# Patient Record
Sex: Female | Born: 1955
Health system: Southern US, Community
[De-identification: ages and names within clinical notes are randomized; demographics above are authoritative.]

## PROBLEM LIST (undated history)

## (undated) DIAGNOSIS — M1990 Unspecified osteoarthritis, unspecified site: Secondary | ICD-10-CM

## (undated) DIAGNOSIS — F410 Panic disorder [episodic paroxysmal anxiety] without agoraphobia: Secondary | ICD-10-CM

## (undated) DIAGNOSIS — G629 Polyneuropathy, unspecified: Secondary | ICD-10-CM

## (undated) HISTORY — DX: Panic disorder (episodic paroxysmal anxiety): F41.0

## (undated) HISTORY — DX: Polyneuropathy, unspecified: G62.9

## (undated) HISTORY — PX: BASAL CELL CARCINOMA EXCISION: SHX1214

## (undated) HISTORY — PX: WISDOM TOOTH EXTRACTION: SHX21

## (undated) HISTORY — DX: Unspecified osteoarthritis, unspecified site: M19.90

---

## 1999-01-16 ENCOUNTER — Other Ambulatory Visit: Admission: RE | Admit: 1999-01-16 | Discharge: 1999-01-16 | Payer: Self-pay | Admitting: *Deleted

## 2000-01-15 ENCOUNTER — Encounter: Admission: RE | Admit: 2000-01-15 | Discharge: 2000-01-15 | Payer: Self-pay | Admitting: *Deleted

## 2000-01-15 ENCOUNTER — Encounter: Payer: Self-pay | Admitting: *Deleted

## 2000-05-29 ENCOUNTER — Other Ambulatory Visit: Admission: RE | Admit: 2000-05-29 | Discharge: 2000-05-29 | Payer: Self-pay | Admitting: *Deleted

## 2001-04-07 ENCOUNTER — Encounter: Admission: RE | Admit: 2001-04-07 | Discharge: 2001-04-07 | Payer: Self-pay | Admitting: *Deleted

## 2001-04-07 ENCOUNTER — Encounter: Payer: Self-pay | Admitting: *Deleted

## 2002-11-14 ENCOUNTER — Encounter: Payer: Self-pay | Admitting: *Deleted

## 2002-11-14 ENCOUNTER — Encounter: Admission: RE | Admit: 2002-11-14 | Discharge: 2002-11-14 | Payer: Self-pay | Admitting: *Deleted

## 2002-11-17 ENCOUNTER — Other Ambulatory Visit: Admission: RE | Admit: 2002-11-17 | Discharge: 2002-11-17 | Payer: Self-pay | Admitting: *Deleted

## 2004-02-14 ENCOUNTER — Encounter: Admission: RE | Admit: 2004-02-14 | Discharge: 2004-02-14 | Payer: Self-pay | Admitting: *Deleted

## 2005-02-26 ENCOUNTER — Encounter: Admission: RE | Admit: 2005-02-26 | Discharge: 2005-02-26 | Payer: Self-pay | Admitting: *Deleted

## 2006-03-25 ENCOUNTER — Encounter: Admission: RE | Admit: 2006-03-25 | Discharge: 2006-03-25 | Payer: Self-pay | Admitting: Obstetrics and Gynecology

## 2006-04-09 ENCOUNTER — Other Ambulatory Visit: Admission: RE | Admit: 2006-04-09 | Discharge: 2006-04-09 | Payer: Self-pay | Admitting: Obstetrics and Gynecology

## 2007-06-08 ENCOUNTER — Encounter: Admission: RE | Admit: 2007-06-08 | Discharge: 2007-06-08 | Payer: Self-pay | Admitting: Obstetrics and Gynecology

## 2007-06-14 ENCOUNTER — Other Ambulatory Visit: Admission: RE | Admit: 2007-06-14 | Discharge: 2007-06-14 | Payer: Self-pay | Admitting: Obstetrics and Gynecology

## 2008-06-08 ENCOUNTER — Encounter: Admission: RE | Admit: 2008-06-08 | Discharge: 2008-06-08 | Payer: Self-pay | Admitting: Obstetrics and Gynecology

## 2008-06-14 ENCOUNTER — Other Ambulatory Visit: Admission: RE | Admit: 2008-06-14 | Discharge: 2008-06-14 | Payer: Self-pay | Admitting: Obstetrics & Gynecology

## 2018-09-28 ENCOUNTER — Other Ambulatory Visit: Payer: Self-pay | Admitting: Ophthalmology

## 2018-09-28 DIAGNOSIS — G902 Horner's syndrome: Secondary | ICD-10-CM

## 2020-02-23 DIAGNOSIS — M25551 Pain in right hip: Secondary | ICD-10-CM | POA: Insufficient documentation

## 2020-02-29 DIAGNOSIS — M25551 Pain in right hip: Secondary | ICD-10-CM | POA: Diagnosis not present

## 2020-02-29 DIAGNOSIS — M1611 Unilateral primary osteoarthritis, right hip: Secondary | ICD-10-CM | POA: Diagnosis not present

## 2020-03-22 DIAGNOSIS — Z720 Tobacco use: Secondary | ICD-10-CM | POA: Diagnosis not present

## 2020-03-22 DIAGNOSIS — Z13220 Encounter for screening for lipoid disorders: Secondary | ICD-10-CM | POA: Diagnosis not present

## 2020-03-22 DIAGNOSIS — M169 Osteoarthritis of hip, unspecified: Secondary | ICD-10-CM | POA: Diagnosis not present

## 2020-03-22 DIAGNOSIS — R0609 Other forms of dyspnea: Secondary | ICD-10-CM | POA: Diagnosis not present

## 2020-03-22 DIAGNOSIS — R5383 Other fatigue: Secondary | ICD-10-CM | POA: Diagnosis not present

## 2020-03-23 ENCOUNTER — Ambulatory Visit: Payer: Self-pay | Admitting: Adult Health Nurse Practitioner

## 2020-03-27 DIAGNOSIS — E7849 Other hyperlipidemia: Secondary | ICD-10-CM | POA: Diagnosis not present

## 2020-03-27 DIAGNOSIS — R7989 Other specified abnormal findings of blood chemistry: Secondary | ICD-10-CM | POA: Diagnosis not present

## 2020-03-27 DIAGNOSIS — R5383 Other fatigue: Secondary | ICD-10-CM | POA: Diagnosis not present

## 2020-03-30 ENCOUNTER — Ambulatory Visit: Payer: Self-pay | Admitting: Adult Health Nurse Practitioner

## 2020-04-13 ENCOUNTER — Other Ambulatory Visit: Payer: Self-pay

## 2020-04-13 ENCOUNTER — Encounter: Payer: Self-pay | Admitting: Cardiology

## 2020-04-13 ENCOUNTER — Ambulatory Visit: Payer: BC Managed Care – PPO | Admitting: Cardiology

## 2020-04-13 VITALS — BP 115/82 | HR 78 | Ht 62.0 in | Wt 170.0 lb

## 2020-04-13 DIAGNOSIS — Z01810 Encounter for preprocedural cardiovascular examination: Secondary | ICD-10-CM | POA: Diagnosis not present

## 2020-04-13 DIAGNOSIS — F17200 Nicotine dependence, unspecified, uncomplicated: Secondary | ICD-10-CM | POA: Insufficient documentation

## 2020-04-13 DIAGNOSIS — F17210 Nicotine dependence, cigarettes, uncomplicated: Secondary | ICD-10-CM | POA: Diagnosis not present

## 2020-04-13 DIAGNOSIS — R0609 Other forms of dyspnea: Secondary | ICD-10-CM

## 2020-04-13 DIAGNOSIS — R0602 Shortness of breath: Secondary | ICD-10-CM | POA: Insufficient documentation

## 2020-04-13 DIAGNOSIS — R0600 Dyspnea, unspecified: Secondary | ICD-10-CM

## 2020-04-13 NOTE — Progress Notes (Addendum)
Patient referred by Michael Boston, MD for pre-op risk stratification  Subjective:   Denise Schwartz, female    DOB: [birth year], 64 y.o.   MRN: [id number]   Chief Complaint  Patient presents with  . New Patient (Initial Visit)  . Medical Clearance    Surgery  . Shortness of Breath     HPI  64 y.o. Caucasian female with tobacco dependence, hyperlipidemia, osteoarthritis  Patients activity has been limited due to her right hip pain, for which she is going to undergo surgery in the near future. While she reports random episodes of chest pain lasting for few min at rest, she is able to climb total of 40 steps to her apartment with grocery bags in hand without any difficulty or chest pain.She has stable, unchanged shortness of breath on exertion for many years.    Past Medical History:  Diagnosis Date  . Neuropathy   . Osteoarthritis   . Panic disorder      Past Surgical History:  Procedure Laterality Date  . BASAL CELL CARCINOMA EXCISION    . WISDOM TOOTH EXTRACTION       Social History   Tobacco Use  Smoking Status Not on file    Social History   Substance and Sexual Activity  Alcohol Use None     Family History  Problem Relation Age of Onset  . Cancer Mother 65  . Osteoarthritis Mother   . Alzheimer's disease Father   . Hypertension Father   . Neuropathy Father   . Hypertension Brother   . Hypertension Brother   . Hypertension Brother   . High Cholesterol Brother   . Cancer Brother      Current Outpatient Medications on File Prior to Visit  Medication Sig Dispense Refill  . acetaminophen (TYLENOL) 325 MG tablet Take 650 mg by mouth every 6 (six) hours as needed.    Marland Kitchen BABY ASPIRIN PO Take 1 tablet by mouth daily as needed.    . diclofenac (VOLTAREN) 75 MG EC tablet Take 1 tablet by mouth 2 (two) times daily as needed.    . loratadine-pseudoephedrine (ALLERGY RELIEF D-12) 5-120 MG tablet Take 1 tablet by mouth 2 (two) times daily as needed for  allergies.    . Multiple Vitamin (MULTIVITAMIN) tablet Take 1 tablet by mouth daily.     No current facility-administered medications on file prior to visit.    Cardiovascular and other pertinent studies:  EKG 04/13/2020: Sinus rhythm 85 bpm Nonspecific ST-T changes    Recent labs: 03/22/2020: Glucose 104, BUN/Cr 16/0.8. EGFR 72. Na/K 138/5.5. Rest of the CMP normal H/H 15/45. MCV 104.6. Platelets 390 HbA1C N/A Chol 233, TG 94, HDL 67, LDL 147 TSH 2.1 normal   Review of Systems  Cardiovascular: Positive for dyspnea on exertion. Negative for chest pain (Non-exertional), leg swelling, palpitations and syncope.  Musculoskeletal: Positive for joint pain.         Vitals:   04/13/20 1112  BP: 115/82  Pulse: 78  SpO2: 97%     Body mass index is 31.09 kg/m. Filed Weights   04/13/20 1112  Weight: 170 lb (77.1 kg)     Objective:   Physical Exam Vitals and nursing note reviewed.  Constitutional:      General: She is not in acute distress. Neck:     Vascular: No JVD.  Cardiovascular:     Rate and Rhythm: Normal rate and regular rhythm.     Pulses: Intact distal pulses.  Heart sounds: Normal heart sounds. No murmur heard.   Pulmonary:     Effort: Pulmonary effort is normal.     Breath sounds: Normal breath sounds. No wheezing or rales.         Assessment & Recommendations:   64 y.o. Caucasian female with tobacco dependence, hyperlipidemia, osteoarthritis, pre-op risk stratification  Pre-op risk stratification: Unchanged stable exertional dyspnea most likely related to tobacco dependence. Will obtain echocardiogram. Decent functional capacity in spite of hip pain, as per HPI, without any chest pain. I do not think stress testing will mitigate her medium cardiovascular risk, owing to her history of smoking. Strongly recommend quitting smoking, currently smokes <1 pack/day. Offered pharmacological therapy, However, she is not interested at this time.Also,  recommended  Statin given her 12.2% 10 yr ASCVD risk, She wants to hold off at this time.  If echocardiogram does not show any severe abnormalities, may proceed with hip surgery with acceptable risk.     Thank you for referring the patient to [place]. Please feel free to contact with any questions.  Nigel Mormon, MD [facility] Pager: [phone] Office: [phone]

## 2020-04-18 NOTE — Telephone Encounter (Signed)
From patient.

## 2020-04-25 ENCOUNTER — Ambulatory Visit: Payer: BC Managed Care – PPO

## 2020-04-25 ENCOUNTER — Other Ambulatory Visit: Payer: Self-pay

## 2020-04-25 DIAGNOSIS — Z0189 Encounter for other specified special examinations: Secondary | ICD-10-CM | POA: Diagnosis not present

## 2020-04-25 DIAGNOSIS — Z01810 Encounter for preprocedural cardiovascular examination: Secondary | ICD-10-CM | POA: Diagnosis not present

## 2020-04-25 DIAGNOSIS — R0609 Other forms of dyspnea: Secondary | ICD-10-CM | POA: Diagnosis not present

## 2020-04-25 DIAGNOSIS — R0600 Dyspnea, unspecified: Secondary | ICD-10-CM

## 2020-04-25 LAB — PROTIME-INR: INR: 0.9 (ref 0.9–1.1)

## 2020-04-30 ENCOUNTER — Ambulatory Visit: Payer: Self-pay | Admitting: Family Medicine

## 2020-05-01 NOTE — Progress Notes (Signed)
Patient called back, I have discussed results with her and transferred call to [facility] to confirm surgical clearance paperwork.

## 2020-05-01 NOTE — Progress Notes (Signed)
Called patient to discuss results left voicemail for patient to return phone call

## 2020-05-02 ENCOUNTER — Ambulatory Visit: Payer: Self-pay | Admitting: Family Medicine

## 2020-05-03 HISTORY — PX: TOTAL HIP ARTHROPLASTY: SHX124

## 2020-05-21 DIAGNOSIS — M1611 Unilateral primary osteoarthritis, right hip: Secondary | ICD-10-CM | POA: Diagnosis not present

## 2020-05-23 ENCOUNTER — Ambulatory Visit: Payer: Self-pay | Admitting: Internal Medicine

## 2020-07-11 DIAGNOSIS — Z96641 Presence of right artificial hip joint: Secondary | ICD-10-CM | POA: Diagnosis not present

## 2020-07-11 DIAGNOSIS — Z471 Aftercare following joint replacement surgery: Secondary | ICD-10-CM | POA: Diagnosis not present

## 2020-08-14 DIAGNOSIS — E785 Hyperlipidemia, unspecified: Secondary | ICD-10-CM | POA: Diagnosis not present

## 2020-08-14 DIAGNOSIS — Z0000 Encounter for general adult medical examination without abnormal findings: Secondary | ICD-10-CM | POA: Diagnosis not present

## 2020-08-21 DIAGNOSIS — E785 Hyperlipidemia, unspecified: Secondary | ICD-10-CM | POA: Diagnosis not present

## 2020-08-21 DIAGNOSIS — Z0000 Encounter for general adult medical examination without abnormal findings: Secondary | ICD-10-CM | POA: Diagnosis not present

## 2020-08-22 ENCOUNTER — Encounter: Payer: Self-pay | Admitting: Nurse Practitioner

## 2020-08-24 DIAGNOSIS — Z96641 Presence of right artificial hip joint: Secondary | ICD-10-CM | POA: Diagnosis not present

## 2020-08-27 ENCOUNTER — Other Ambulatory Visit: Payer: Self-pay | Admitting: Internal Medicine

## 2020-08-27 DIAGNOSIS — Z1231 Encounter for screening mammogram for malignant neoplasm of breast: Secondary | ICD-10-CM

## 2020-09-13 ENCOUNTER — Ambulatory Visit (INDEPENDENT_AMBULATORY_CARE_PROVIDER_SITE_OTHER): Payer: BC Managed Care – PPO | Admitting: Nurse Practitioner

## 2020-09-13 ENCOUNTER — Other Ambulatory Visit (HOSPITAL_COMMUNITY)
Admission: RE | Admit: 2020-09-13 | Discharge: 2020-09-13 | Disposition: A | Discharge status: Home / Self Care | Payer: BC Managed Care – PPO | Source: Ambulatory Visit | Attending: Nurse Practitioner | Admitting: Nurse Practitioner

## 2020-09-13 ENCOUNTER — Encounter: Payer: Self-pay | Admitting: Nurse Practitioner

## 2020-09-13 ENCOUNTER — Other Ambulatory Visit: Payer: Self-pay

## 2020-09-13 VITALS — BP 118/74 | HR 72 | Resp 16 | Ht 61.75 in | Wt 185.0 lb

## 2020-09-13 DIAGNOSIS — Z780 Asymptomatic menopausal state: Secondary | ICD-10-CM

## 2020-09-13 DIAGNOSIS — Z01419 Encounter for gynecological examination (general) (routine) without abnormal findings: Secondary | ICD-10-CM

## 2020-09-13 DIAGNOSIS — Z720 Tobacco use: Secondary | ICD-10-CM | POA: Diagnosis not present

## 2020-09-13 NOTE — Progress Notes (Signed)
64 y.o. G0P0000 Single White or Caucasian female here for annual exam.      Last physical 10 years ago 64 year female partner, passed away 50 + smoking Strong support with family (nieces) Had hip surgery earlier this year and realized she has a lot of health maintenance to catch up on. Has a PCP, had a lot of blood work done but not sure what. (She knows cholesterol because that was high and she was sent to cardiologist. Everything was okay, does not want to take meds because she was told her overall risk was low)  No LMP recorded. Patient is postmenopausal.  Denies any post menopausal bleeding    Sexually active: No.  The current method of family planning is abstinence.   Female partner Exercising: No.  exercise Smoker:  no  Health Maintenance: Pap:  Maybe 2010  History of abnormal Pap:  no MMG:  2012 per patient, scheduled 10/2020 Colonoscopy:  none BMD:   none TDaP:  Unsure, declines Gardasil:   n/a Covid-19: had done Pneumonia vaccine(s):  Not done Shingrix:   Not done Hep C testing: unsure Screening Labs: see above note   reports that she has been smoking cigarettes. She has never used smokeless tobacco. She reports that she does not use drugs.  Past Medical History:  Diagnosis Date  . Neuropathy   . Osteoarthritis   . Panic disorder     Past Surgical History:  Procedure Laterality Date  . BASAL CELL CARCINOMA EXCISION    . TOTAL HIP ARTHROPLASTY Right 05/2020  . WISDOM TOOTH EXTRACTION      Current Outpatient Medications  Medication Sig Dispense Refill  . acetaminophen (TYLENOL) 325 MG tablet Take 650 mg by mouth every 6 (six) hours as needed.    Marland Kitchen amoxicillin (AMOXIL) 500 MG tablet amoxicillin 500 mg tablet  take 4 tab 1 hour prior to dental work    . diclofenac (VOLTAREN) 75 MG EC tablet Take 1 tablet by mouth 2 (two) times daily as needed.    . loratadine-pseudoephedrine (ALLERGY RELIEF D-12) 5-120 MG tablet Take 1 tablet by mouth 2 (two) times daily as  needed for allergies.    . Multiple Vitamin (MULTIVITAMIN) tablet Take 1 tablet by mouth as needed.      No current facility-administered medications for this visit.    Family History  Problem Relation Age of Onset  . Cancer Mother 45  . Osteoarthritis Mother   . Uterine cancer Mother   . Alzheimer's disease Father   . Hypertension Father   . Neuropathy Father   . Hypertension Brother   . Hypertension Brother   . Hypertension Brother   . Cancer Brother   . Throat cancer Brother   . Diabetes Paternal Grandmother     Review of Systems  Constitutional: Negative.   HENT: Negative.   Eyes: Negative.   Respiratory: Negative.   Cardiovascular: Negative.   Gastrointestinal: Negative.   Endocrine: Negative.   Genitourinary: Negative.   Musculoskeletal: Negative.   Skin: Negative.   Allergic/Immunologic: Negative.   Neurological: Negative.   Hematological: Negative.   Psychiatric/Behavioral: Negative.     Exam:   BP 118/74   Pulse 72   Resp 16   Ht 5' 1.75" (1.568 m)   Wt 185 lb (83.9 kg)   BMI 34.11 kg/m   Height: 5' 1.75" (156.8 cm)  General appearance: alert, cooperative and appears stated age Head: Normocephalic, without obvious abnormality, atraumatic Neck: no adenopathy, supple, symmetrical, trachea midline and thyroid  normal to inspection and palpation Lungs: clear to auscultation bilaterally Breasts: normal appearance, no masses or tenderness Heart: regular rate and rhythm Abdomen: soft, non-tender; bowel sounds normal; no masses,  no organomegaly Extremities: extremities normal, atraumatic, no cyanosis or edema Skin: Skin color, texture, turgor normal. No rashes or lesions Lymph nodes: Cervical, supraclavicular, and axillary nodes normal. No abnormal inguinal nodes palpated Neurologic: Grossly normal   Pelvic: External genitalia:  no lesions              Urethra:  normal appearing urethra with no masses, tenderness or lesions              Bartholins and  Skenes: normal                 Vagina: normal appearing vagina with normal color and discharge, no lesions              Cervix: nulliparous appearance, difficult to visualize in entirety  r/t  Use of adolescent pederson speculum needed to do exam              Pap taken: Yes.   Bimanual Exam:  Uterus:  normal              Adnexa: no mass, fullness, tenderness               Rectovaginal: Pt declined   Joy, CMA Chaperone was present for exam.  A:  Well Woman with normal exam  P:   Mammogram, ordered by PCP, scheduled for December 2021  pap smear/ HPV collected today  DEXA scan ordered (pt to call breast center and see if can be done same day as mammogram). Risks: long time smoking history  Labs today: vit D, HIV, RPR, Hep C.   Pt will sign medical release to review labs from PCP to see if any additional recommendations  Cologuard ordered   return annually or prn

## 2020-09-13 NOTE — Patient Instructions (Addendum)
I am glad you made it in today! It was nice to meet you. Today we caught up on things: Pap smear/HPV test Lab work: (one time): HIV, RPR, Hep C and vitamin D  Considering taking Calcium 1200mg  daily/vit d 800 IU daily (unless lab work abnormal, then will update recommendations)  We have ordered your colguard and it should come to you in the mail.  Your results will come to you on MyChart.

## 2020-09-14 ENCOUNTER — Other Ambulatory Visit: Payer: Self-pay | Admitting: Nurse Practitioner

## 2020-09-14 DIAGNOSIS — Z780 Asymptomatic menopausal state: Secondary | ICD-10-CM

## 2020-09-14 DIAGNOSIS — Z720 Tobacco use: Secondary | ICD-10-CM

## 2020-09-14 LAB — HEPATITIS C ANTIBODY: Hep C Virus Ab: <0.1 s/co ratio (ref 0.0–0.9)

## 2020-09-14 LAB — HIV ANTIBODY (ROUTINE TESTING W REFLEX): HIV Screen 4th Generation wRfx: NONREACTIVE

## 2020-09-14 LAB — VITAMIN D 25 HYDROXY (VIT D DEFICIENCY, FRACTURES): Vit D, 25-Hydroxy: 28.2 ng/mL — ABNORMAL LOW (ref 30.0–100.0)

## 2020-09-14 LAB — RPR: RPR Ser Ql: NONREACTIVE

## 2020-09-18 LAB — CYTOLOGY - PAP
Comment: NEGATIVE
Diagnosis: NEGATIVE
Diagnosis: REACTIVE
High risk HPV: NEGATIVE

## 2020-09-20 ENCOUNTER — Telehealth: Payer: Self-pay

## 2020-09-20 ENCOUNTER — Other Ambulatory Visit: Payer: Self-pay | Admitting: Nurse Practitioner

## 2020-09-20 DIAGNOSIS — R87619 Unspecified abnormal cytological findings in specimens from cervix uteri: Secondary | ICD-10-CM

## 2020-09-20 DIAGNOSIS — Z8049 Family history of malignant neoplasm of other genital organs: Secondary | ICD-10-CM

## 2020-09-20 NOTE — Telephone Encounter (Signed)
Spoke with pt. Pt given results and recommendations per Tresa Endo, NP. Pt verbalized understanding and is agreeable to PUS and EMB. Pt aware Dr Oscar La will do procedures.  Pt scheduled with Dr Oscar La on 11/30 at 230 pm. Pt agreeable to date and time of appts. Pt advised can take Motrin 800 mg with food and water one hour before procedure. Reviewed cancellation policy and pt is aware of transvaginal US.  Routing to Dr Oscar La for review [id number] for precert  Orders placed  Encounter closed  [name], I attempted to call you about your pap smear, but I didn't get an answer. You can send a message back to me if you have any questions and we can discuss further.  The cells on your cervix look normal and the HPV test was negative, so the actual pap smear part was normal. The part that needs further evaluation is that they saw some endometrial cells, and typically they are not seen. There is an increase risk of endometrial cancer (uterine cancer) in women where these cells are seen. It doesn't mean you have cancer, but it means we have to do a test to see. I know that this kind of cancer is in your family history, so it is important to test you. I recommend that you have a pelvic ultrasound and then an Endometrial Biopsy so you can get the answers you need. The nurse should be calling you to get you scheduled for these procedures.  Written by Clarita Crane, NP on 09/20/2020 9:28 AM EST Seen by patient Denise Schwartz on 09/20/2020 10:04 AM

## 2020-09-20 NOTE — Telephone Encounter (Signed)
Patient is calling to discuss pap results and next steps needed.

## 2020-09-24 ENCOUNTER — Telehealth: Payer: Self-pay

## 2020-09-24 NOTE — Telephone Encounter (Signed)
Call to patient. Unable to leave voicemail requesting a return call to [facility] to review benefits for scheduled Pelvic ultrasound and Endometrial Biopsy with Gertie Exon, MD

## 2020-10-02 ENCOUNTER — Encounter: Payer: Self-pay | Admitting: Obstetrics and Gynecology

## 2020-10-02 ENCOUNTER — Ambulatory Visit (INDEPENDENT_AMBULATORY_CARE_PROVIDER_SITE_OTHER): Payer: BC Managed Care – PPO

## 2020-10-02 ENCOUNTER — Other Ambulatory Visit (HOSPITAL_COMMUNITY)
Admission: RE | Admit: 2020-10-02 | Discharge: 2020-10-02 | Disposition: A | Discharge status: Home / Self Care | Payer: BC Managed Care – PPO | Source: Ambulatory Visit | Attending: Obstetrics and Gynecology | Admitting: Obstetrics and Gynecology

## 2020-10-02 ENCOUNTER — Other Ambulatory Visit: Payer: Self-pay | Admitting: Obstetrics and Gynecology

## 2020-10-02 ENCOUNTER — Other Ambulatory Visit: Payer: Self-pay

## 2020-10-02 ENCOUNTER — Ambulatory Visit (INDEPENDENT_AMBULATORY_CARE_PROVIDER_SITE_OTHER): Payer: BC Managed Care – PPO | Admitting: Obstetrics and Gynecology

## 2020-10-02 VITALS — BP 128/80 | HR 89 | Resp 16 | Wt 185.0 lb

## 2020-10-02 DIAGNOSIS — Z8049 Family history of malignant neoplasm of other genital organs: Secondary | ICD-10-CM | POA: Insufficient documentation

## 2020-10-02 DIAGNOSIS — R87619 Unspecified abnormal cytological findings in specimens from cervix uteri: Secondary | ICD-10-CM | POA: Diagnosis not present

## 2020-10-02 DIAGNOSIS — Z6834 Body mass index (BMI) 34.0-34.9, adult: Secondary | ICD-10-CM

## 2020-10-02 DIAGNOSIS — N882 Stricture and stenosis of cervix uteri: Secondary | ICD-10-CM | POA: Diagnosis not present

## 2020-10-02 DIAGNOSIS — Z1212 Encounter for screening for malignant neoplasm of rectum: Secondary | ICD-10-CM | POA: Diagnosis not present

## 2020-10-02 DIAGNOSIS — Z1211 Encounter for screening for malignant neoplasm of colon: Secondary | ICD-10-CM | POA: Diagnosis not present

## 2020-10-02 NOTE — Progress Notes (Signed)
GYNECOLOGY  VISIT   HPI: 64 y.o.   Single White or Caucasian Not Hispanic or Latino  female   G0P0000 with No LMP recorded. Patient is postmenopausal.   here for evaluation of endometrial cells on recent pap smear. The pap was otherwise negative with negative HPV testing. She denies PMP bleeding. Per Clarita Crane, NP it was difficult exam even with adolescent speculum. The patient has a FH of uterine cancer and a BMI of 34.     GYNECOLOGIC HISTORY: No LMP recorded. Patient is postmenopausal. Contraception:Abstinence Menopausal hormone therapy: none        OB History    Gravida  0   Para  0   Term  0   Preterm  0   AB  0   Living  0     SAB  0   TAB  0   Ectopic  0   Multiple  0   Live Births  0              Patient Active Problem List   Diagnosis Date Noted  . Preop cardiovascular exam 04/13/2020  . SOB (shortness of breath) 04/13/2020  . Tobacco dependence 04/13/2020    Past Medical History:  Diagnosis Date  . Neuropathy   . Osteoarthritis   . Panic disorder     Past Surgical History:  Procedure Laterality Date  . BASAL CELL CARCINOMA EXCISION    . TOTAL HIP ARTHROPLASTY Right 05/2020  . WISDOM TOOTH EXTRACTION      Current Outpatient Medications  Medication Sig Dispense Refill  . acetaminophen (TYLENOL) 325 MG tablet Take 650 mg by mouth every 6 (six) hours as needed.    Marland Kitchen amoxicillin (AMOXIL) 500 MG tablet amoxicillin 500 mg tablet  take 4 tab 1 hour prior to dental work    . diclofenac (VOLTAREN) 75 MG EC tablet Take 1 tablet by mouth 2 (two) times daily as needed.    . loratadine-pseudoephedrine (ALLERGY RELIEF D-12) 5-120 MG tablet Take 1 tablet by mouth 2 (two) times daily as needed for allergies.    . Multiple Vitamin (MULTIVITAMIN) tablet Take 1 tablet by mouth as needed.      No current facility-administered medications for this visit.     ALLERGIES: Naproxen  Family History  Problem Relation Age of Onset  . Cancer Mother 36  .  Osteoarthritis Mother   . Uterine cancer Mother   . Gallstones Mother   . Alzheimer's disease Father   . Hypertension Father   . Neuropathy Father   . Hypertension Brother   . Hypertension Brother   . Hypertension Brother   . Cancer Brother   . Throat cancer Brother   . Diabetes Paternal Grandmother     Social History   Socioeconomic History  . Marital status: Single    Spouse name: Not on file  . Number of children: 0  . Years of education: Not on file  . Highest education level: Not on file  Occupational History  . Not on file  Tobacco Use  . Smoking status: Current Every Day Smoker    Types: Cigarettes  . Smokeless tobacco: Never Used  Vaping Use  . Vaping Use: Never used  Substance and Sexual Activity  . Alcohol use: Not on file    Comment: 2-3 times a month  . Drug use: Never  . Sexual activity: Not Currently    Partners: Female    Birth control/protection: Post-menopausal  Other Topics Concern  . Not  on file  Social History Narrative  . Not on file   Social Determinants of Health   Financial Resource Strain:   . Difficulty of Paying Living Expenses: Not on file  Food Insecurity:   . Worried About Programme researcher, broadcasting/film/video in the Last Year: Not on file  . Ran Out of Food in the Last Year: Not on file  Transportation Needs:   . Lack of Transportation (Medical): Not on file  . Lack of Transportation (Non-Medical): Not on file  Physical Activity:   . Days of Exercise per Week: Not on file  . Minutes of Exercise per Session: Not on file  Stress:   . Feeling of Stress : Not on file  Social Connections:   . Frequency of Communication with Friends and Family: Not on file  . Frequency of Social Gatherings with Friends and Family: Not on file  . Attends Religious Services: Not on file  . Active Member of Clubs or Organizations: Not on file  . Attends Banker Meetings: Not on file  . Marital Status: Not on file  Intimate Partner Violence:   . Fear of  Current or Ex-Partner: Not on file  . Emotionally Abused: Not on file  . Physically Abused: Not on file  . Sexually Abused: Not on file    Review of Systems  Constitutional: Negative.   HENT: Negative.   Eyes: Negative.   Respiratory: Negative.   Cardiovascular: Negative.   Gastrointestinal: Negative.   Genitourinary: Negative.   Musculoskeletal: Negative.   Skin: Negative.   Neurological: Negative.   Endo/Heme/Allergies: Negative.   Psychiatric/Behavioral: Negative.     PHYSICAL EXAMINATION:    BP 128/80 (BP Location: Right Arm, Patient Position: Sitting, Cuff Size: Large)   Pulse 89   Resp 16   Wt 185 lb (83.9 kg)   SpO2 97%   BMI 34.11 kg/m     General appearance: alert, cooperative and appears stated age  Pelvic: External genitalia:  no lesions              Urethra:  normal appearing urethra with no masses, tenderness or lesions              Bartholins and Skenes: normal                 Vagina: normal appearing vagina with normal color and discharge, no lesions              Cervix: no lesions and stenotic             The risks of endometrial biopsy were reviewed and a consent was obtained.  A speculum was placed in the vagina and the cervix was cleansed with betadine. A tenaculum was placed on the cervix and the cervix was dilated with mini-dilators. The mini-pipelle was placed into the endometrial cavity. The uterus sounded to 5-6 cm. The endometrial biopsy was performed, taking care to get a representative sample, sampling 360 degrees of the uterine cavity. Minimal tissue was obtained. The tenaculum and speculum were removed. There were no complications.   Chaperone was present for exam.  Ultrasound images were reviewed with the patient, fluid in the cavity, uniformly thin appearing stripe  ASSESSMENT Endometrial cells on pap Endometrial fluid, thin endometrium Cervical stenosis  PLAN Endometrial biopsy done Further plans depending on results.

## 2020-10-02 NOTE — Patient Instructions (Signed)

## 2020-10-04 ENCOUNTER — Other Ambulatory Visit: Payer: Self-pay | Admitting: Nurse Practitioner

## 2020-10-04 ENCOUNTER — Ambulatory Visit: Payer: Self-pay

## 2020-10-04 LAB — SURGICAL PATHOLOGY

## 2020-10-05 ENCOUNTER — Telehealth: Payer: Self-pay

## 2020-10-05 NOTE — Telephone Encounter (Signed)
-----   Message from Romualdo Bolk, MD sent at 10/05/2020  1:10 PM EST ----- Please let the patient know that her endometrial biopsy sample was scant, pathologist states there is insufficient material to evaluate. Given her ultrasound and thin lining, my suspicion for significant pathology is low. The only way to be sure is to resample her. She was very uncomfortable with the biopsy and has a stenotic cervix. One option is to pre treat her with cytotec and ibuprofen and re biopsy her in the office, or to do a hysteroscopy, D&C in the OR. Please review these options with her

## 2020-10-05 NOTE — Telephone Encounter (Signed)
Spoke with pt. Pt given results and recommendations per Dr Oscar La. Pt verbalized understanding. Pt declines all further testing at this time. Advised will review with Dr Oscar La and return call with any further recommendations/advice. Pt agreeable.   Routing to Dr Oscar La.

## 2020-10-15 ENCOUNTER — Telehealth: Payer: Self-pay | Admitting: Nurse Practitioner

## 2020-10-15 DIAGNOSIS — R195 Other fecal abnormalities: Secondary | ICD-10-CM

## 2020-10-15 NOTE — Telephone Encounter (Signed)
Called patient regarding positive Cologuard result. Advised she will need colonoscopy for follow up. She would like to schedule with Dr. Matthias Hughs. Her preference would be before the end of the year if possible.  Routing to triage.

## 2020-10-15 NOTE — Telephone Encounter (Signed)
Routing to [facility] for referral  Encounter closed

## 2020-10-16 ENCOUNTER — Ambulatory Visit: Payer: Self-pay

## 2020-10-16 ENCOUNTER — Encounter: Payer: Self-pay | Admitting: Nurse Practitioner

## 2020-10-16 DIAGNOSIS — Z01419 Encounter for gynecological examination (general) (routine) without abnormal findings: Secondary | ICD-10-CM

## 2020-10-16 LAB — COLOGUARD: Cologuard: POSITIVE — AB

## 2020-10-17 ENCOUNTER — Telehealth: Payer: Self-pay

## 2020-10-17 ENCOUNTER — Encounter: Payer: Self-pay | Admitting: Obstetrics and Gynecology

## 2020-10-17 NOTE — Telephone Encounter (Signed)
Patient returned a call to Jill.   

## 2020-10-17 NOTE — Telephone Encounter (Signed)
Denise Schwartz "[name]"  P [facility] Pool When Tresa Endo called me the other day I thought she told me my result from cologuard was positive. My chart is showing negative. Which is it?

## 2020-10-17 NOTE — Telephone Encounter (Addendum)
Left message to call Noreene Larsson, RN at [facility] [phone].   Reviewed [facility] Cologuard report collected on 10/02/20, reported 10/10/20.   Test result is positive, reviewed by Clarita Crane, NP on 10/12/20, referral to GI for colonoscopy was made.  See 10/15/20 telephone encounter.   Results updated in Epic, original report to be scanned into Epic.  Call to patient to notify.

## 2020-10-18 NOTE — Telephone Encounter (Signed)
She just needs to understand that there is a very small chance that we are missing abnormal pathology by not reevaluating her.

## 2020-10-18 NOTE — Telephone Encounter (Signed)
Call to patient. Patient advised Cologuard result was positive. Patient states she looked at her MyChart again last night and realized she was looking at the reference range (negative) and not her actual results. Patient advised referral in place to Dr. Donavan Burnet office. Advised would be contacted by his office to schedule colonoscopy. Patient agreeable and appreciative of phone call.   Routing to provider and will close encounter.   CC Dr. Oscar La

## 2020-10-18 NOTE — Telephone Encounter (Signed)
Call returned to patient to f/u. Advised results were updated in Epic, copy of [facility] report to be scanned into chart. Patient thankful for f/u and verbalizes understanding.

## 2020-10-19 NOTE — Telephone Encounter (Signed)
Spoke with pt. Pt given update per Dr Oscar La. Pt verbalized understanding. Pt states is thinking about having hysteroscopy but does not want to do it anytime soon. Pt states will call after first of year if has more questions or wanting to schedule. Pt advised to call and we can set up OV or Mychart visit to discuss further. Pt agreeable.  Pt thankful for callback.  Routing to Dr Oscar La for final review Encounter closed

## 2020-12-06 ENCOUNTER — Other Ambulatory Visit: Payer: BC Managed Care – PPO

## 2020-12-06 ENCOUNTER — Other Ambulatory Visit: Payer: Self-pay

## 2020-12-09 ENCOUNTER — Other Ambulatory Visit: Payer: Self-pay | Admitting: Nurse Practitioner

## 2020-12-09 DIAGNOSIS — E559 Vitamin D deficiency, unspecified: Secondary | ICD-10-CM

## 2020-12-09 DIAGNOSIS — Z113 Encounter for screening for infections with a predominantly sexual mode of transmission: Secondary | ICD-10-CM

## 2021-01-23 DIAGNOSIS — R195 Other fecal abnormalities: Secondary | ICD-10-CM | POA: Diagnosis not present

## 2021-01-24 ENCOUNTER — Other Ambulatory Visit: Payer: Self-pay | Admitting: Internal Medicine

## 2021-01-24 ENCOUNTER — Other Ambulatory Visit: Payer: Self-pay | Admitting: Nurse Practitioner

## 2021-01-24 DIAGNOSIS — Z780 Asymptomatic menopausal state: Secondary | ICD-10-CM

## 2021-01-24 DIAGNOSIS — Z720 Tobacco use: Secondary | ICD-10-CM

## 2021-01-24 DIAGNOSIS — Z1231 Encounter for screening mammogram for malignant neoplasm of breast: Secondary | ICD-10-CM

## 2021-02-05 DIAGNOSIS — L720 Epidermal cyst: Secondary | ICD-10-CM | POA: Diagnosis not present

## 2021-02-05 DIAGNOSIS — I788 Other diseases of capillaries: Secondary | ICD-10-CM | POA: Diagnosis not present

## 2021-02-05 DIAGNOSIS — Z85828 Personal history of other malignant neoplasm of skin: Secondary | ICD-10-CM | POA: Diagnosis not present

## 2021-02-05 DIAGNOSIS — L821 Other seborrheic keratosis: Secondary | ICD-10-CM | POA: Diagnosis not present

## 2021-02-12 DIAGNOSIS — K621 Rectal polyp: Secondary | ICD-10-CM | POA: Diagnosis not present

## 2021-02-12 DIAGNOSIS — K5730 Diverticulosis of large intestine without perforation or abscess without bleeding: Secondary | ICD-10-CM | POA: Diagnosis not present

## 2021-02-12 DIAGNOSIS — R195 Other fecal abnormalities: Secondary | ICD-10-CM | POA: Diagnosis not present

## 2021-02-19 DIAGNOSIS — K621 Rectal polyp: Secondary | ICD-10-CM | POA: Diagnosis not present

## 2021-03-13 DIAGNOSIS — Z96641 Presence of right artificial hip joint: Secondary | ICD-10-CM | POA: Diagnosis not present

## 2021-03-13 DIAGNOSIS — M25552 Pain in left hip: Secondary | ICD-10-CM | POA: Diagnosis not present

## 2021-03-19 ENCOUNTER — Other Ambulatory Visit: Payer: Self-pay

## 2021-03-19 ENCOUNTER — Ambulatory Visit
Admission: RE | Admit: 2021-03-19 | Discharge: 2021-03-19 | Disposition: A | Discharge status: Home / Self Care | Payer: Medicare HMO | Source: Ambulatory Visit | Attending: Internal Medicine | Admitting: Internal Medicine

## 2021-03-19 DIAGNOSIS — Z1231 Encounter for screening mammogram for malignant neoplasm of breast: Secondary | ICD-10-CM

## 2021-03-27 DIAGNOSIS — H5202 Hypermetropia, left eye: Secondary | ICD-10-CM | POA: Diagnosis not present

## 2021-03-27 DIAGNOSIS — H52202 Unspecified astigmatism, left eye: Secondary | ICD-10-CM | POA: Diagnosis not present

## 2021-03-27 DIAGNOSIS — H2513 Age-related nuclear cataract, bilateral: Secondary | ICD-10-CM | POA: Diagnosis not present

## 2021-03-28 DIAGNOSIS — Z0100 Encounter for examination of eyes and vision without abnormal findings: Secondary | ICD-10-CM | POA: Diagnosis not present

## 2021-06-28 ENCOUNTER — Other Ambulatory Visit: Payer: Self-pay | Admitting: Nurse Practitioner

## 2021-06-28 DIAGNOSIS — Z720 Tobacco use: Secondary | ICD-10-CM

## 2021-06-28 DIAGNOSIS — E2839 Other primary ovarian failure: Secondary | ICD-10-CM

## 2021-07-03 ENCOUNTER — Ambulatory Visit
Admission: RE | Admit: 2021-07-03 | Discharge: 2021-07-03 | Disposition: A | Discharge status: Home / Self Care | Payer: Medicare HMO | Source: Ambulatory Visit | Attending: Nurse Practitioner | Admitting: Nurse Practitioner

## 2021-07-03 ENCOUNTER — Other Ambulatory Visit: Payer: Self-pay

## 2021-07-03 DIAGNOSIS — M810 Age-related osteoporosis without current pathological fracture: Secondary | ICD-10-CM | POA: Diagnosis not present

## 2021-07-03 DIAGNOSIS — Z720 Tobacco use: Secondary | ICD-10-CM

## 2021-07-03 DIAGNOSIS — E2839 Other primary ovarian failure: Secondary | ICD-10-CM

## 2021-07-03 DIAGNOSIS — Z780 Asymptomatic menopausal state: Secondary | ICD-10-CM | POA: Diagnosis not present

## 2021-07-03 DIAGNOSIS — M8589 Other specified disorders of bone density and structure, multiple sites: Secondary | ICD-10-CM | POA: Diagnosis not present

## 2021-07-04 ENCOUNTER — Telehealth: Payer: Self-pay

## 2021-07-04 NOTE — Telephone Encounter (Signed)
I called patient about her bone density test result and she brought up another concern. Last November she had endometrial cells on her PAP. She returned and had endometrial biopsy but insufficient material for dx. She had u/s that showed thin lining. Dr. Oscar La had recommended either returning for another attempt at biopsy with some Rx's or D&C Hysteroscopy.  Patient was going to consider options and call back to schedule and never did.  We discussed the recommedations in detail again. She said she thinks about it a lot because her mother had endometrial cancer.  She decided what she wants to do is return in November for her scheduled AEX and have another Pap smear and if it shows the same she is leaning toward scheduling D&C, Hyst.

## 2021-07-22 ENCOUNTER — Encounter: Payer: Self-pay | Admitting: Obstetrics and Gynecology

## 2021-07-22 ENCOUNTER — Ambulatory Visit: Payer: Medicare HMO | Admitting: Obstetrics and Gynecology

## 2021-07-22 ENCOUNTER — Other Ambulatory Visit: Payer: Self-pay

## 2021-07-22 VITALS — BP 130/64 | HR 88

## 2021-07-22 DIAGNOSIS — E559 Vitamin D deficiency, unspecified: Secondary | ICD-10-CM

## 2021-07-22 DIAGNOSIS — M810 Age-related osteoporosis without current pathological fracture: Secondary | ICD-10-CM | POA: Diagnosis not present

## 2021-07-22 MED ORDER — ALENDRONATE SODIUM 70 MG PO TABS: 70.0000 mg | ORAL_TABLET | ORAL | 3 refills | Status: DC

## 2021-07-22 NOTE — Progress Notes (Signed)
GYNECOLOGY  VISIT   HPI: 65 y.o.   Single White or Caucasian Not Hispanic or Latino  female   G0P0000 with No LMP recorded. Patient is postmenopausal.   here to go over dexa results.   DEXA from 07/03/21 returned with a T score of -2.5 in her spine.   She is getting a women's vitamin with calcium and vit d. She gets 2 cups of Almond milk a day, lots of vegetables. She doesn't exercise regularly, but lives in a 3rd floor walk up.  She smokes a pack a day. No ETOH.   No reflux.    GYNECOLOGIC HISTORY: No LMP recorded. Patient is postmenopausal. Contraception:pmp  Menopausal hormone therapy: none         OB History     Gravida  0   Para  0   Term  0   Preterm  0   AB  0   Living  0      SAB  0   IAB  0   Ectopic  0   Multiple  0   Live Births  0              Patient Active Problem List   Diagnosis Date Noted   Preop cardiovascular exam 04/13/2020   SOB (shortness of breath) 04/13/2020   Tobacco dependence 04/13/2020    Past Medical History:  Diagnosis Date   Neuropathy    Osteoarthritis    Panic disorder     Past Surgical History:  Procedure Laterality Date   BASAL CELL CARCINOMA EXCISION     TOTAL HIP ARTHROPLASTY Right 05/2020   WISDOM TOOTH EXTRACTION      Current Outpatient Medications  Medication Sig Dispense Refill   acetaminophen (TYLENOL) 325 MG tablet Take 650 mg by mouth every 6 (six) hours as needed.     amoxicillin (AMOXIL) 500 MG tablet amoxicillin 500 mg tablet  take 4 tab 1 hour prior to dental work     diclofenac (VOLTAREN) 75 MG EC tablet Take 1 tablet by mouth 2 (two) times daily as needed.     loratadine-pseudoephedrine (ALLERGY RELIEF D-12) 5-120 MG tablet Take 1 tablet by mouth 2 (two) times daily as needed for allergies.     Multiple Vitamin (MULTIVITAMIN) tablet Take 1 tablet by mouth as needed.      Ibuprofen (ADVIL) 200 MG CAPS Advil     No current facility-administered medications for this visit.      ALLERGIES: Naproxen  Family History  Problem Relation Age of Onset   Cancer Mother 14   Osteoarthritis Mother    Uterine cancer Mother    Gallstones Mother    Alzheimer's disease Father    Hypertension Father    Neuropathy Father    Hypertension Brother    Hypertension Brother    Hypertension Brother    Cancer Brother    Throat cancer Brother    Diabetes Paternal Grandmother     Social History   Socioeconomic History   Marital status: Single    Spouse name: Not on file   Number of children: 0   Years of education: Not on file   Highest education level: Not on file  Occupational History   Not on file  Tobacco Use   Smoking status: Every Day    Types: Cigarettes   Smokeless tobacco: Never  Vaping Use   Vaping Use: Never used  Substance and Sexual Activity   Alcohol use: Not on file    Comment:  2-3 times a month   Drug use: Never   Sexual activity: Not Currently    Partners: Female    Birth control/protection: Post-menopausal  Other Topics Concern   Not on file  Social History Narrative   Not on file   Social Determinants of Health   Financial Resource Strain: Not on file  Food Insecurity: Not on file  Transportation Needs: Not on file  Physical Activity: Not on file  Stress: Not on file  Social Connections: Not on file  Intimate Partner Violence: Not on file    Review of Systems  All other systems reviewed and are negative.  PHYSICAL EXAMINATION:    BP 130/64   Pulse 88   SpO2 100%     General appearance: alert, cooperative and appears stated age  69. Age-related osteoporosis without current pathological fracture Discussed her DEXA results Discussed calcium and vit d intake Discussed exercise Discussed risks of smoking and osteoporosis Discussed reducing falls.  - CBC - Comprehensive metabolic panel - VITAMIN D 25 Hydroxy (Vit-D Deficiency, Fractures) - Magnesium - Phosphorus -Discussed the recommendation for treatment. Discussed that  bisphosphonate's are the #1 recommended treatment for osteoporosis. No contraindications. Discussed the risks of treatment, including but not limited to: esophageal irritation, myalgias, rare cases of atypical fractures, rare cases of osteonecrosis of the jaw.  - alendronate (FOSAMAX) 70 MG tablet; Take 1 tablet (70 mg total) by mouth every 7 (seven) days. Take first thing in am with 6 oz. Water.  Be upright after taking.  Eat nothing for one hour.  Dispense: 12 tablet; Refill: 3  2. Vitamin D insufficiency - VITAMIN D 25 Hydroxy (Vit-D Deficiency, Fractures)

## 2021-07-23 LAB — CBC
HCT: 44.2 % (ref 35.0–45.0)
Hemoglobin: 14.1 g/dL (ref 11.7–15.5)
MCH: 32.6 pg (ref 27.0–33.0)
MCHC: 31.9 g/dL — ABNORMAL LOW (ref 32.0–36.0)
MCV: 102.3 fL — ABNORMAL HIGH (ref 80.0–100.0)
MPV: 9.7 fL (ref 7.5–12.5)
Platelets: 393 10*3/uL (ref 140–400)
RBC: 4.32 10*6/uL (ref 3.80–5.10)
RDW: 12.3 % (ref 11.0–15.0)
WBC: 8.6 10*3/uL (ref 3.8–10.8)

## 2021-07-23 LAB — COMPREHENSIVE METABOLIC PANEL
AG Ratio: 1.7 (calc) (ref 1.0–2.5)
ALT: 16 U/L (ref 6–29)
AST: 20 U/L (ref 10–35)
Albumin: 4.5 g/dL (ref 3.6–5.1)
Alkaline phosphatase (APISO): 66 U/L (ref 37–153)
BUN/Creatinine Ratio: 49 (calc) — ABNORMAL HIGH (ref 6–22)
BUN: 20 mg/dL (ref 7–25)
CO2: 27 mmol/L (ref 20–32)
Calcium: 10 mg/dL (ref 8.6–10.4)
Chloride: 101 mmol/L (ref 98–110)
Creat: 0.41 mg/dL — ABNORMAL LOW (ref 0.50–1.05)
Globulin: 2.6 g/dL (calc) (ref 1.9–3.7)
Glucose, Bld: 112 mg/dL — ABNORMAL HIGH (ref 65–99)
Potassium: 5 mmol/L (ref 3.5–5.3)
Sodium: 137 mmol/L (ref 135–146)
Total Bilirubin: 0.5 mg/dL (ref 0.2–1.2)
Total Protein: 7.1 g/dL (ref 6.1–8.1)

## 2021-07-23 LAB — MAGNESIUM: Magnesium: 2.2 mg/dL (ref 1.5–2.5)

## 2021-07-23 LAB — VITAMIN D 25 HYDROXY (VIT D DEFICIENCY, FRACTURES): Vit D, 25-Hydroxy: 33 ng/mL (ref 30–100)

## 2021-07-23 LAB — PHOSPHORUS: Phosphorus: 4.2 mg/dL (ref 2.1–4.3)

## 2021-07-26 ENCOUNTER — Telehealth: Payer: Self-pay | Admitting: *Deleted

## 2021-07-26 NOTE — Telephone Encounter (Signed)
Patient called with concerns pertaining to her glucose results.  Lab result date 07/22/2021 Glucose 112 Patient wants to know if any recommendations should be followed up pertaining to this. I will route to provider for recommendations

## 2021-07-29 NOTE — Telephone Encounter (Signed)
That level of glucose is only elevated if she was fasting at the time of the blood draw. If she was fasting, then have her come in for a HgbA1C.

## 2021-07-30 NOTE — Telephone Encounter (Signed)
Left message for pt to return our call. 

## 2021-08-02 NOTE — Telephone Encounter (Signed)
Patient called back regarding the below note. Patient said she was not fasting this day, I explained if not fasting this would cause elevated glucose levels. Patient verbalized she understood aware, No follow up needed. She is due to have fasting blood work with PCP if elevated she will discuss with PCP.

## 2021-08-16 DIAGNOSIS — M810 Age-related osteoporosis without current pathological fracture: Secondary | ICD-10-CM | POA: Diagnosis not present

## 2021-08-16 DIAGNOSIS — E785 Hyperlipidemia, unspecified: Secondary | ICD-10-CM | POA: Diagnosis not present

## 2021-08-23 DIAGNOSIS — Z0000 Encounter for general adult medical examination without abnormal findings: Secondary | ICD-10-CM | POA: Diagnosis not present

## 2021-08-23 DIAGNOSIS — E663 Overweight: Secondary | ICD-10-CM | POA: Diagnosis not present

## 2021-08-23 DIAGNOSIS — M810 Age-related osteoporosis without current pathological fracture: Secondary | ICD-10-CM | POA: Diagnosis not present

## 2021-08-23 DIAGNOSIS — Z23 Encounter for immunization: Secondary | ICD-10-CM | POA: Diagnosis not present

## 2021-08-23 DIAGNOSIS — Z720 Tobacco use: Secondary | ICD-10-CM | POA: Diagnosis not present

## 2021-08-23 DIAGNOSIS — E785 Hyperlipidemia, unspecified: Secondary | ICD-10-CM | POA: Diagnosis not present

## 2021-08-23 DIAGNOSIS — R7301 Impaired fasting glucose: Secondary | ICD-10-CM | POA: Diagnosis not present

## 2021-08-23 DIAGNOSIS — Z6827 Body mass index (BMI) 27.0-27.9, adult: Secondary | ICD-10-CM | POA: Diagnosis not present

## 2021-08-27 IMAGING — MG MM DIGITAL SCREENING BILAT W/ TOMO AND CAD
8 series · 8 of 24 positions shown · non-contrast
Comparison: Previous exam(s).

CLINICAL DATA: Screening.

EXAM:
DIGITAL SCREENING BILATERAL MAMMOGRAM WITH TOMOSYNTHESIS AND CAD
TECHNIQUE: Bilateral screening digital craniocaudal and mediolateral oblique
mammograms were obtained. Bilateral screening digital breast
tomosynthesis was performed. The images were evaluated with
computer-aided detection.

[L MLO synth-2D]
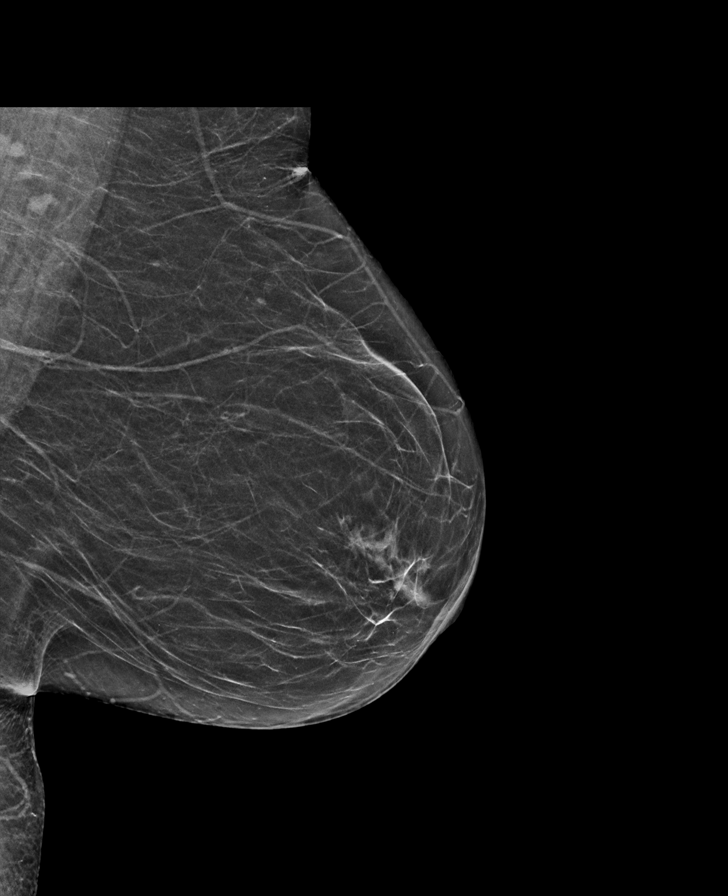

[R CC synth-2D]
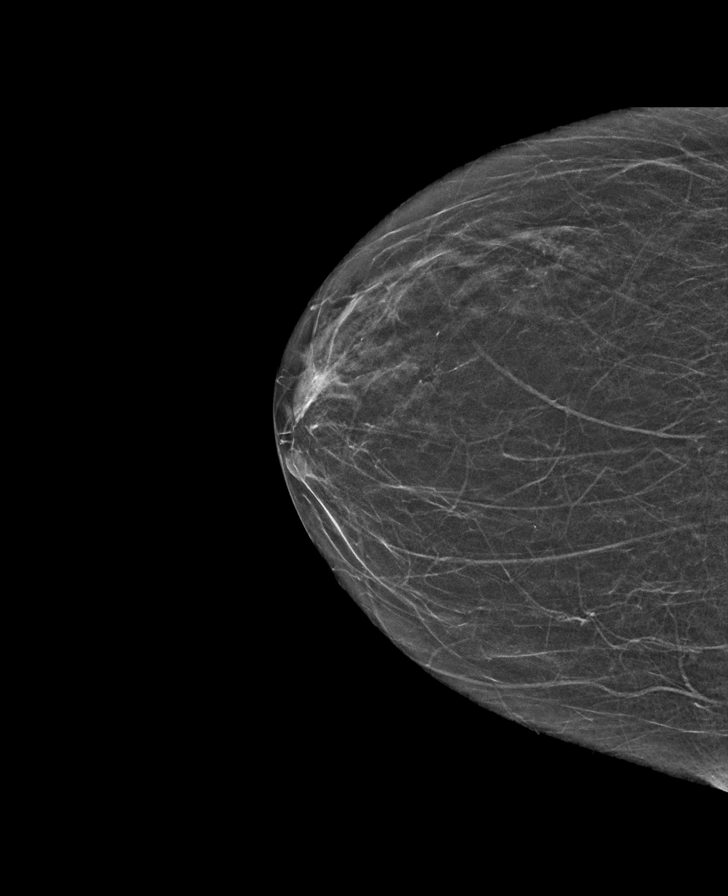

[L CC synth-2D]
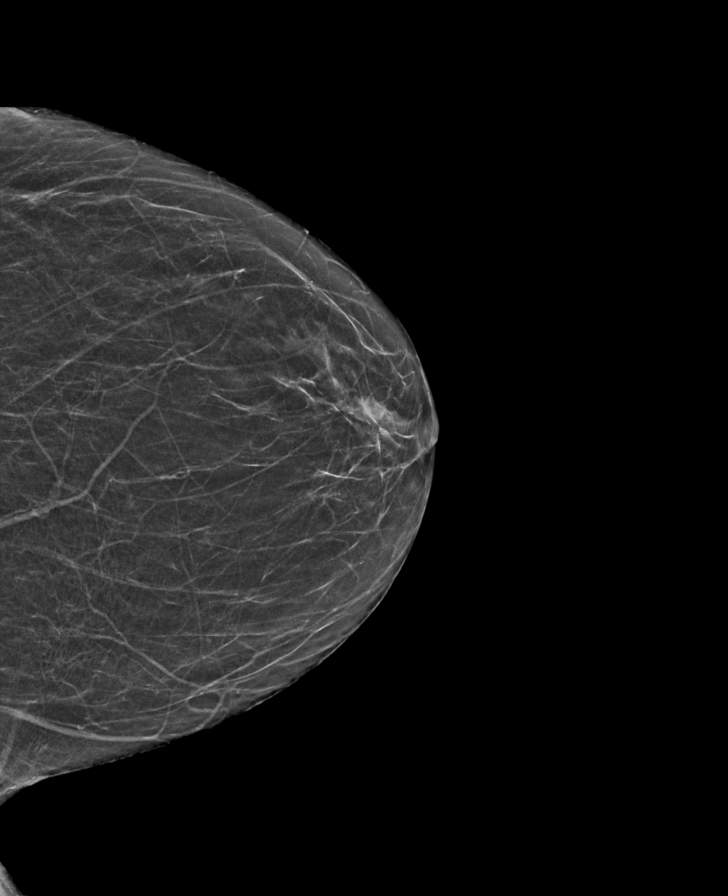

[R MLO synth-2D]
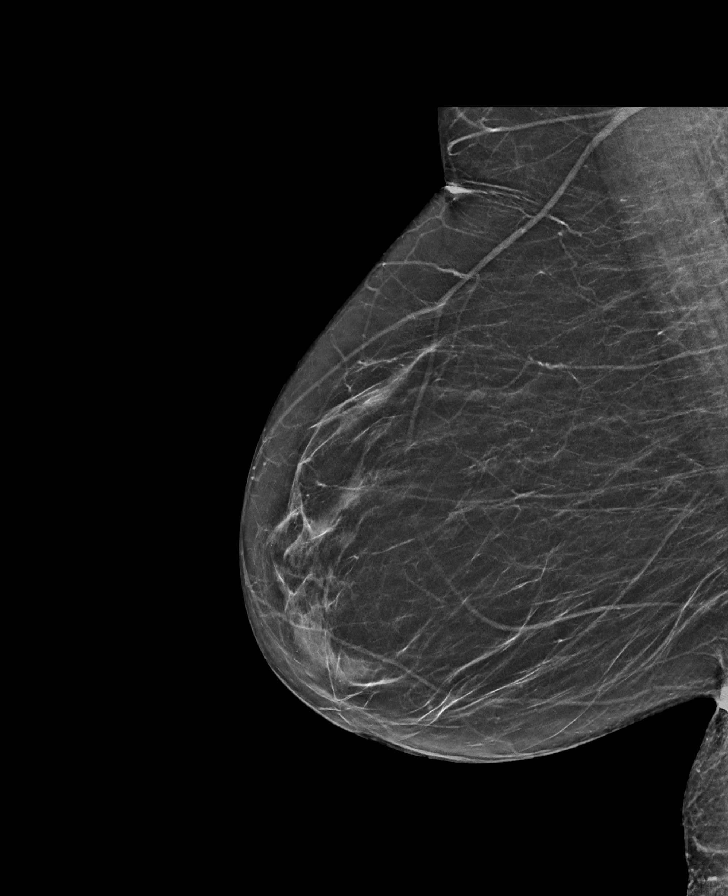

[R MLO tomo · tomo slice 28/55.0]
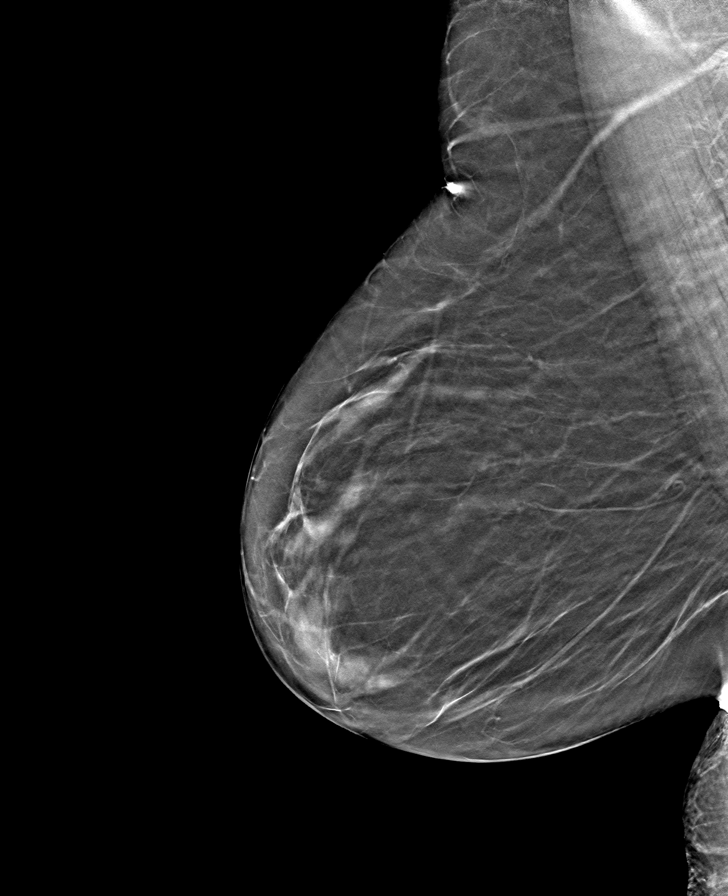

[L CC tomo · tomo slice 24/47.0]
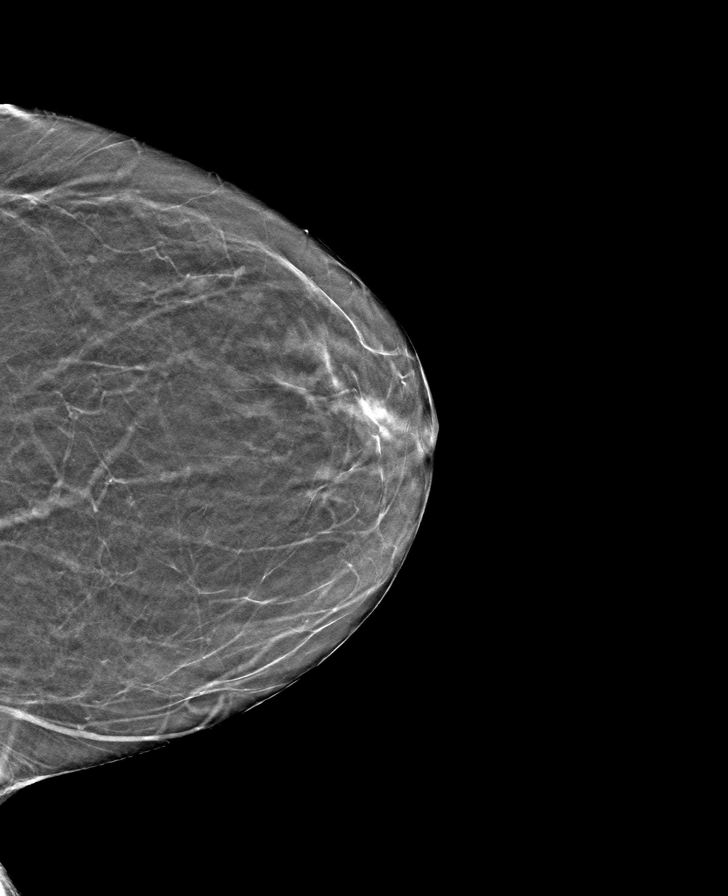

[L MLO tomo · tomo slice 27/54.0]
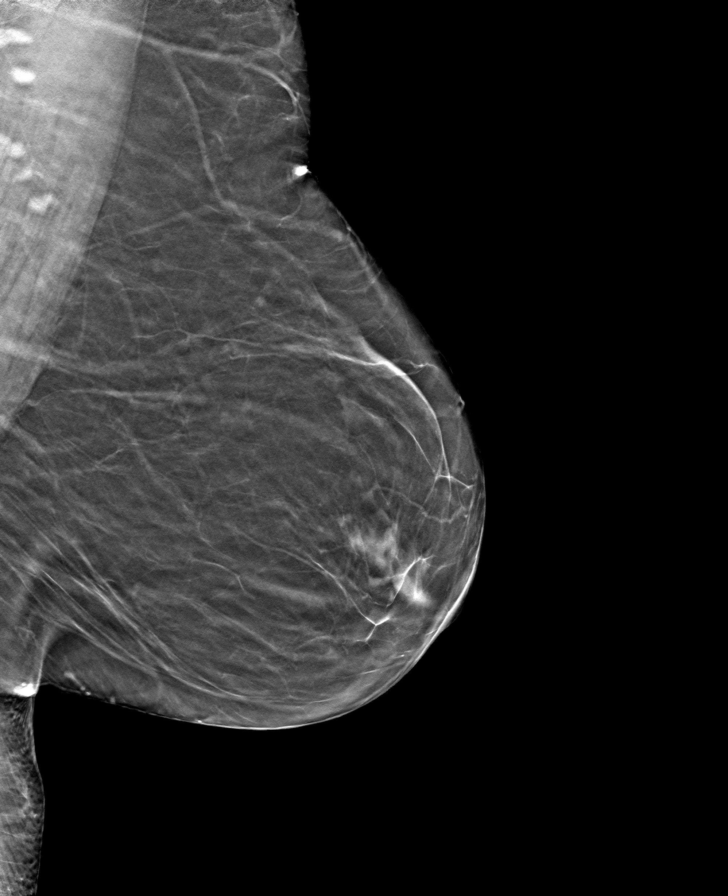

[R CC tomo · tomo slice 23/45.0]
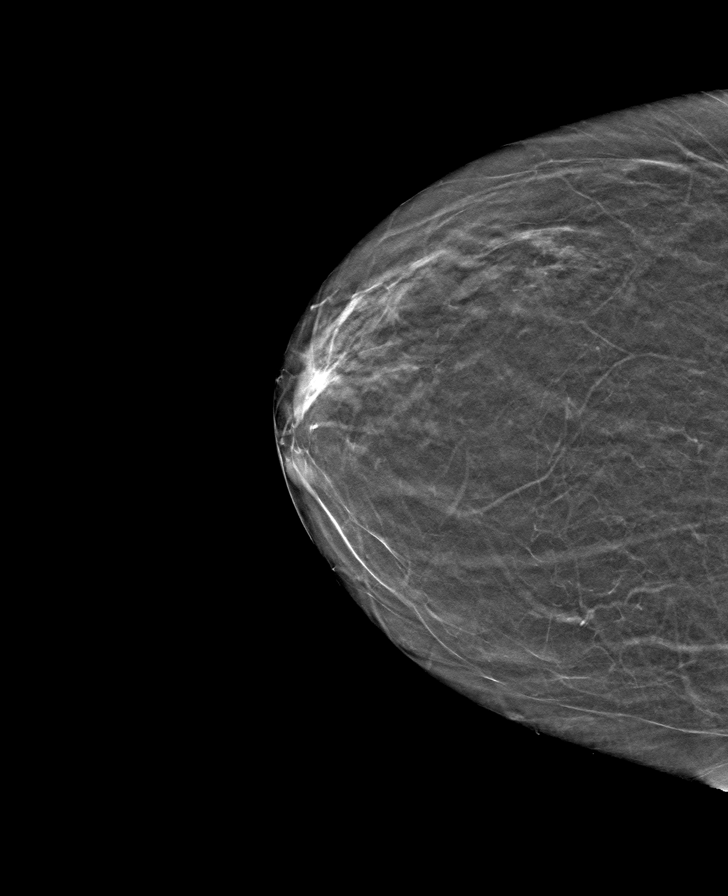

[8 of 24 positions shown; findings below may reference images not displayed]

ACR Breast Density Category b: There are scattered areas of
fibroglandular density.
FINDINGS: There are no findings suspicious for malignancy. The images were
evaluated with computer-aided detection.
IMPRESSION: No mammographic evidence of malignancy. A result letter of this
screening mammogram will be mailed directly to the patient.

RECOMMENDATION:
Screening mammogram in one year. (Code:[id number]-[id number])

BI-RADS CATEGORY  1: Negative.

## 2021-09-16 ENCOUNTER — Ambulatory Visit: Payer: BC Managed Care – PPO | Admitting: Nurse Practitioner

## 2021-10-10 ENCOUNTER — Ambulatory Visit (HOSPITAL_COMMUNITY): Admit: 2021-10-10 | Disposition: A | Discharge status: Still In Hospital | Payer: Self-pay

## 2021-10-10 ENCOUNTER — Encounter (HOSPITAL_COMMUNITY): Payer: Self-pay

## 2021-10-10 ENCOUNTER — Ambulatory Visit (HOSPITAL_COMMUNITY)
Admission: EM | Admit: 2021-10-10 | Discharge: 2021-10-10 | Disposition: A | Discharge status: Home / Self Care | Payer: Medicare HMO | Attending: Family Medicine | Admitting: Family Medicine

## 2021-10-10 ENCOUNTER — Other Ambulatory Visit: Payer: Self-pay

## 2021-10-10 DIAGNOSIS — F17200 Nicotine dependence, unspecified, uncomplicated: Secondary | ICD-10-CM | POA: Diagnosis not present

## 2021-10-10 DIAGNOSIS — J209 Acute bronchitis, unspecified: Secondary | ICD-10-CM

## 2021-10-10 MED ORDER — DOXYCYCLINE HYCLATE 100 MG PO CAPS: 100.0000 mg | ORAL_CAPSULE | Freq: Two times a day (BID) | ORAL | 0 refills | Status: DC

## 2021-10-10 MED ORDER — BENZONATATE 100 MG PO CAPS: 100.0000 mg | ORAL_CAPSULE | Freq: Three times a day (TID) | ORAL | 0 refills | Status: DC

## 2021-10-10 NOTE — ED Provider Notes (Signed)
[facility]    CSN: [id number] Arrival date & time: 10/10/21  3790      History   Chief Complaint Chief Complaint  Patient presents with   Cough    HPI Denise Schwartz is a 65 y.o. female.  5 weeks ago started with cough with chest congestion, mucous;  runny nose;  got a bit better, then worsened again.  This has continued.  The last 2 days having more sob, that she did not notice before.  No fevers.  No ear pain/pressure.  No wheezing.  Taking a lot of otc meds without much help.  She is a smoker, 1ppd.    Cough Associated symptoms: no chest pain, no chills, no fever, no shortness of breath, no sore throat and no wheezing    Past Medical History:  Diagnosis Date   Neuropathy    Osteoarthritis    Panic disorder     Patient Active Problem List   Diagnosis Date Noted   Preop cardiovascular exam 04/13/2020   SOB (shortness of breath) 04/13/2020   Tobacco dependence 04/13/2020   Pain in joint of right hip 02/23/2020    Past Surgical History:  Procedure Laterality Date   BASAL CELL CARCINOMA EXCISION     TOTAL HIP ARTHROPLASTY Right 05/2020   WISDOM TOOTH EXTRACTION      OB History     Gravida  0   Para  0   Term  0   Preterm  0   AB  0   Living  0      SAB  0   IAB  0   Ectopic  0   Multiple  0   Live Births  0            Home Medications    Prior to Admission medications   Medication Sig Start Date End Date Taking? Authorizing Provider  acetaminophen (TYLENOL) 325 MG tablet Take 650 mg by mouth every 6 (six) hours as needed.    [provider]  alendronate (FOSAMAX) 70 MG tablet Take 1 tablet (70 mg total) by mouth every 7 (seven) days. Take first thing in am with 6 oz. Water.  Be upright after taking.  Eat nothing for one hour. 07/22/21   Romualdo Bolk, MD  diclofenac (VOLTAREN) 75 MG EC tablet Take 1 tablet by mouth 2 (two) times daily as needed.    [provider]  Ibuprofen (ADVIL) 200 MG  CAPS Advil    [provider]  loratadine-pseudoephedrine (ALLERGY RELIEF D-12) 5-120 MG tablet Take 1 tablet by mouth 2 (two) times daily as needed for allergies.    [provider]  Multiple Vitamin (MULTIVITAMIN) tablet Take 1 tablet by mouth as needed.     [provider]    Family History Family History  Problem Relation Age of Onset   Cancer Mother 60   Osteoarthritis Mother    Uterine cancer Mother    Gallstones Mother    Alzheimer's disease Father    Hypertension Father    Neuropathy Father    Hypertension Brother    Hypertension Brother    Hypertension Brother    Cancer Brother    Throat cancer Brother    Diabetes Paternal Grandmother     Social History Social History   Tobacco Use   Smoking status: Every Day    Types: Cigarettes   Smokeless tobacco: Never  Vaping Use   Vaping Use: Never used  Substance Use Topics  Drug use: Never     Allergies   Naproxen   Review of Systems Review of Systems  Constitutional:  Negative for chills, fatigue and fever.  HENT:  Positive for congestion. Negative for sinus pain and sore throat.   Respiratory:  Positive for cough. Negative for shortness of breath and wheezing.   Cardiovascular:  Negative for chest pain.  Gastrointestinal: Negative.     Physical Exam Triage Vital Signs ED Triage Vitals  Enc Vitals Group     BP 10/10/21 1056 125/88     Pulse Rate 10/10/21 1056 92     Resp 10/10/21 1056 18     Temp 10/10/21 1056 98.9 F (37.2 C)     Temp Source 10/10/21 1056 Oral     SpO2 10/10/21 1056 94 %     Weight --      Height --      Head Circumference --      Peak Flow --      Pain Score 10/10/21 1057 0     Pain Loc --      Pain Edu? --      Excl. in GC? --    No data found.  Updated Vital Signs BP 125/88 (BP Location: Left Arm)   Pulse 92   Temp 98.9 F (37.2 C) (Oral)   Resp 18   SpO2 94%   Physical Exam Constitutional:      Appearance: Normal appearance.  HENT:      Head: Normocephalic and atraumatic.     Nose: Nose normal.     Mouth/Throat:     Mouth: Mucous membranes are moist.  Cardiovascular:     Rate and Rhythm: Normal rate and regular rhythm.  Pulmonary:     Effort: Pulmonary effort is normal.     Breath sounds: Rhonchi present.  Musculoskeletal:     Cervical back: Normal range of motion and neck supple.  Neurological:     Mental Status: She is alert.     UC Treatments / Results  Labs (all labs ordered are listed, but only abnormal results are displayed) Labs Reviewed - No data to display  EKG   Radiology No results found.  Procedures Procedures (including critical care time)  Medications Ordered in UC Medications - No data to display  Initial Impression / Assessment and Plan / UC Course  I have reviewed the triage vital signs and the nursing notes.  Pertinent labs & imaging results that were available during my care of the patient were reviewed by me and considered in my medical decision making (see chart for details).     Final Clinical Impressions(s) / UC Diagnoses   Final diagnoses:  Acute bronchitis, unspecified organism  Tobacco use disorder     Discharge Instructions      You have been diagnosed with bronchitis.  Please start and complete the antibiotic.  I have also given you a pill to help with the cough.   If you develop further shortness of breath then please follow up here or with your primary care provider.   I recommend to stop smoking.     ED Prescriptions   None    PDMP not reviewed this encounter.   Jannifer Franklin, MD 10/10/21 1120

## 2021-10-10 NOTE — ED Triage Notes (Signed)
Pt c/o cough and congestion x5 weeks. States developed SOB x2 days. No distress noted. Speaking in complete sentences.

## 2021-10-10 NOTE — Discharge Instructions (Signed)
You have been diagnosed with bronchitis.  Please start and complete the antibiotic.  I have also given you a pill to help with the cough.   If you develop further shortness of breath then please follow up here or with your primary care provider.   I recommend to stop smoking.

## 2021-11-05 DIAGNOSIS — R509 Fever, unspecified: Secondary | ICD-10-CM | POA: Diagnosis not present

## 2021-11-05 DIAGNOSIS — R051 Acute cough: Secondary | ICD-10-CM | POA: Diagnosis not present

## 2021-11-05 DIAGNOSIS — R5383 Other fatigue: Secondary | ICD-10-CM | POA: Diagnosis not present

## 2021-11-05 DIAGNOSIS — R0981 Nasal congestion: Secondary | ICD-10-CM | POA: Diagnosis not present

## 2021-11-05 DIAGNOSIS — Z1152 Encounter for screening for COVID-19: Secondary | ICD-10-CM | POA: Diagnosis not present

## 2021-11-05 DIAGNOSIS — U071 COVID-19: Secondary | ICD-10-CM | POA: Diagnosis not present

## 2021-11-13 ENCOUNTER — Ambulatory Visit: Payer: Medicare HMO | Admitting: Nurse Practitioner

## 2022-02-20 NOTE — Progress Notes (Signed)
66 y.o. G0P0000 Single White or Caucasian Not Hispanic or Latino female here for Breast and pelvic exam.  No vaginal bleeding. ? ?No bowel or bladder c/o.  ? ?Her pap from 11/21 had endometrial cells, lining was thin, biopsy was scant and not adequate. She declined further evaluation at that time.  ?  ?She was seen in 9/22 to discuss her DEXA, + osteoporosis. She was started on Fosamax, but stopped in January when she got covid.  ?She had terrible leg cramps and hair loss on the fosamax.  ?She is getting calcium and vit D ? ?No LMP recorded. Patient is postmenopausal.          ?Sexually active: No.  ?The current method of family planning is post menopausal status.   Female partner  ?Exercising: No.  The patient does not participate in regular exercise at present. ?Smoker:  yes 1.5 pack a day,  no current plans to quit.  ? ?Health Maintenance: ?Pap:  09/13/20 WNL HR HPV Neg, +endometrial cells.  ?History of abnormal Pap:  no ?MMG:  03/19/21 density B Bi-rads 1 neg  ?BMD:   07/03/21 Osteoporotic T-score -2.5 ?Cologuard 10/02/20: positive ?Colonoscopy: 02/12/21, f/u 10 years.  ?TDaP:  has declined in the past  ?Gardasil: n/a ? ? reports that she has been smoking cigarettes. She has never used smokeless tobacco. She reports that she does not use drugs. Rare ETOH. Retired, ran a weight loss center, worked in Airline pilot. 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  ? ?Past Medical History:  ?Diagnosis Date  ? Neuropathy   ? Osteoarthritis   ? Panic disorder   ? ? ?Past Surgical History:  ?Procedure Laterality Date  ? BASAL CELL CARCINOMA EXCISION    ? TOTAL HIP ARTHROPLASTY Right 05/2020  ? WISDOM TOOTH EXTRACTION    ? ? ?Current Outpatient Medications  ?Medication Sig Dispense Refill  ? acetaminophen (TYLENOL) 325 MG tablet Take 650 mg by mouth every 6 (six) hours as needed.    ? amoxicillin (AMOXIL) 500 MG tablet SMARTSIG:4 Tablet(s) By Mouth    ? diclofenac (VOLTAREN) 75 MG EC tablet Take 1 tablet by mouth 2 (two) times  daily as needed.    ? Ibuprofen 200 MG CAPS Advil    ? loratadine-pseudoephedrine (ALLERGY RELIEF D-12) 5-120 MG tablet Take 1 tablet by mouth 2 (two) times daily as needed for allergies.    ? Multiple Vitamin (MULTIVITAMIN) tablet Take 1 tablet by mouth as needed.     ? ?No current facility-administered medications for this visit.  ? ? ?Family History  ?Problem Relation Age of Onset  ? Cancer Mother 43  ? Osteoarthritis Mother   ? Uterine cancer Mother   ? Gallstones Mother   ? Alzheimer's disease Father   ? Hypertension Father   ? Neuropathy Father   ? Hypertension Brother   ? Hypertension Brother   ? Hypertension Brother   ? Cancer Brother   ? Throat cancer Brother   ? Diabetes Paternal Grandmother   ? ? ?Review of Systems  ?All other systems reviewed and are negative. ? ?Exam:   ?BP 124/72   Pulse 86   Ht 5\' 2"  (1.575 m)   Wt 153 lb (69.4 kg)   SpO2 99%   BMI 27.98 kg/m?   Weight change: @WEIGHTCHANGE @ Height:   Height: 5\' 2"  (157.5 cm)  ?Ht Readings from Last 3 Encounters:  ?02/27/22 5\' 2"  (1.575 m)  ?09/13/20 5' 1.75" (1.568 m)  ?04/13/20 5\' 2"  (1.575 m)  ? ? ?  General appearance: alert, cooperative and appears stated age ?Head: Normocephalic, without obvious abnormality, atraumatic ?Neck: no adenopathy, supple, symmetrical, trachea midline and thyroid normal to inspection and palpation ?Breasts: normal appearance, no masses or tenderness ?Abdomen: soft, non-tender; non distended,  no masses,  no organomegaly ?Extremities: extremities normal, atraumatic, no cyanosis or edema ?Skin: Skin color, texture, turgor normal. No rashes or lesions ?Lymph nodes: Cervical, supraclavicular, and axillary nodes normal. ?No abnormal inguinal nodes palpated ?Neurologic: Grossly normal ? ? ?Pelvic: External genitalia:  no lesions ?             Urethra:  normal appearing urethra with no masses, tenderness or lesions ?             Bartholins and Skenes: normal    ?             Vagina: normal appearing vagina with normal color  and discharge, no lesions ?             Cervix: no lesions and stenotic ?              ?Bimanual Exam:  Uterus:   no masses or tenderness ?             Adnexa: no mass, fullness, tenderness ?              Rectovaginal: declined ? ?Arnold Long, RN chaperoned for the exam. ? ?1. Well woman exam ?Mammogram next month ?Colonoscopy UTD ?Labs UTD ? ?2. Screening for cervical cancer ?- Cytology - PAP ? ?3. History of abnormal cervical Pap smear ?Endometrial cells on pap in 11/21, biopsy not diagnostic, thin stripe on U/S. No bleeding ?- Cytology - PAP ? ?4. Age-related osteoporosis without current pathological fracture ?She didn't tolerate fosamax ?Discussed calcium and vit d intake ?Discussed decreasing her risk of falls ?Recommend f/u DEXA in 8/24 (will put in recall to order) ? ? ? ?

## 2022-02-27 ENCOUNTER — Encounter: Payer: Self-pay | Admitting: Obstetrics and Gynecology

## 2022-02-27 ENCOUNTER — Other Ambulatory Visit (HOSPITAL_COMMUNITY)
Admission: RE | Admit: 2022-02-27 | Discharge: 2022-02-27 | Disposition: A | Discharge status: Home / Self Care | Payer: Medicare HMO | Source: Ambulatory Visit | Attending: Obstetrics and Gynecology | Admitting: Obstetrics and Gynecology

## 2022-02-27 ENCOUNTER — Ambulatory Visit (INDEPENDENT_AMBULATORY_CARE_PROVIDER_SITE_OTHER): Payer: Medicare HMO | Admitting: Obstetrics and Gynecology

## 2022-02-27 VITALS — BP 124/72 | HR 86 | Ht 62.0 in | Wt 153.0 lb

## 2022-02-27 DIAGNOSIS — Z9189 Other specified personal risk factors, not elsewhere classified: Secondary | ICD-10-CM | POA: Diagnosis not present

## 2022-02-27 DIAGNOSIS — Z124 Encounter for screening for malignant neoplasm of cervix: Secondary | ICD-10-CM | POA: Insufficient documentation

## 2022-02-27 DIAGNOSIS — Z8742 Personal history of other diseases of the female genital tract: Secondary | ICD-10-CM

## 2022-02-27 DIAGNOSIS — M810 Age-related osteoporosis without current pathological fracture: Secondary | ICD-10-CM | POA: Diagnosis not present

## 2022-02-27 DIAGNOSIS — K5730 Diverticulosis of large intestine without perforation or abscess without bleeding: Secondary | ICD-10-CM | POA: Insufficient documentation

## 2022-02-27 DIAGNOSIS — Z01419 Encounter for gynecological examination (general) (routine) without abnormal findings: Secondary | ICD-10-CM

## 2022-02-27 NOTE — Patient Instructions (Signed)

## 2022-02-28 LAB — CYTOLOGY - PAP: Diagnosis: NEGATIVE

## 2022-03-19 DIAGNOSIS — L821 Other seborrheic keratosis: Secondary | ICD-10-CM | POA: Diagnosis not present

## 2022-03-19 DIAGNOSIS — Z85828 Personal history of other malignant neoplasm of skin: Secondary | ICD-10-CM | POA: Diagnosis not present

## 2022-03-19 DIAGNOSIS — L239 Allergic contact dermatitis, unspecified cause: Secondary | ICD-10-CM | POA: Diagnosis not present

## 2022-03-20 ENCOUNTER — Other Ambulatory Visit: Payer: Self-pay | Admitting: Internal Medicine

## 2022-03-20 DIAGNOSIS — Z1231 Encounter for screening mammogram for malignant neoplasm of breast: Secondary | ICD-10-CM

## 2022-04-23 ENCOUNTER — Ambulatory Visit: Payer: Medicare HMO

## 2022-04-30 ENCOUNTER — Ambulatory Visit: Payer: Medicare HMO

## 2022-05-13 ENCOUNTER — Ambulatory Visit: Payer: Medicare HMO

## 2022-05-21 ENCOUNTER — Ambulatory Visit
Admission: RE | Admit: 2022-05-21 | Discharge: 2022-05-21 | Disposition: A | Discharge status: Home / Self Care | Payer: Medicare HMO | Source: Ambulatory Visit | Attending: Internal Medicine | Admitting: Internal Medicine

## 2022-05-21 DIAGNOSIS — Z1231 Encounter for screening mammogram for malignant neoplasm of breast: Secondary | ICD-10-CM

## 2022-08-20 DIAGNOSIS — R5383 Other fatigue: Secondary | ICD-10-CM | POA: Diagnosis not present

## 2022-08-20 DIAGNOSIS — E785 Hyperlipidemia, unspecified: Secondary | ICD-10-CM | POA: Diagnosis not present

## 2022-08-20 DIAGNOSIS — R7989 Other specified abnormal findings of blood chemistry: Secondary | ICD-10-CM | POA: Diagnosis not present

## 2022-08-20 DIAGNOSIS — M810 Age-related osteoporosis without current pathological fracture: Secondary | ICD-10-CM | POA: Diagnosis not present

## 2022-08-27 DIAGNOSIS — Z0000 Encounter for general adult medical examination without abnormal findings: Secondary | ICD-10-CM | POA: Diagnosis not present

## 2022-08-27 DIAGNOSIS — E785 Hyperlipidemia, unspecified: Secondary | ICD-10-CM | POA: Diagnosis not present

## 2022-08-27 DIAGNOSIS — Z23 Encounter for immunization: Secondary | ICD-10-CM | POA: Diagnosis not present

## 2022-08-27 DIAGNOSIS — Z720 Tobacco use: Secondary | ICD-10-CM | POA: Diagnosis not present

## 2022-08-27 DIAGNOSIS — M810 Age-related osteoporosis without current pathological fracture: Secondary | ICD-10-CM | POA: Diagnosis not present

## 2023-05-28 ENCOUNTER — Telehealth: Payer: Self-pay

## 2023-05-28 ENCOUNTER — Encounter: Payer: Self-pay | Admitting: Internal Medicine

## 2023-05-28 DIAGNOSIS — M810 Age-related osteoporosis without current pathological fracture: Secondary | ICD-10-CM

## 2023-05-28 NOTE — Telephone Encounter (Signed)
JJ pt LVM stating that she called [facility] to schedule her DEXA scan since it will be 2 years after 07/04/2023 since her last. Reports that she was told by them that they need an order prior to scheduling and is requesting order. Pt also mentioned that she had questions regarding pap tests.  Pt's last AEX/seen 02/27/2022--recall sent for B&P in 02/2023.   P-09/13/20-WNL, HPV- neg (+endometrial cells), bx not diagnostic, thin stripe on [place] (10/02/2020). No bleeding  P-02/27/2022-WNL (atrophy), routine f/u per JJ.  Hx of osteoporosis, stopped fosamax after +COVID in 11/2021.  Pt is >65, unknown if high/low risk. Will contact pt back on 05/29/2023.

## 2023-05-29 ENCOUNTER — Other Ambulatory Visit: Payer: Self-pay | Admitting: Internal Medicine

## 2023-05-29 DIAGNOSIS — Z1231 Encounter for screening mammogram for malignant neoplasm of breast: Secondary | ICD-10-CM

## 2023-05-29 NOTE — Telephone Encounter (Signed)
LVMTCB

## 2023-06-02 NOTE — Telephone Encounter (Signed)
Pt returned call

## 2023-06-02 NOTE — Telephone Encounter (Signed)
It is best to continue with the same location for bone density testing.  If she changes to a new location, it will be difficult to compare results.  This is a standard approach for all bone density testing.   She may wish to be placed on a cancellation list for [facility].   Ok to schedule her follow up visit at the office, but I would do it after she has had her bone density done.   She is due for her mammogram.

## 2023-06-02 NOTE — Telephone Encounter (Signed)
Spoke w/ pt advised her that I will check with provider on recommendation for DEXA since has been going to [facility] for them and is due in 06/2023. However, they are scheduling out DEXAs @ [facility] until 11/2024.  Pt reports her PCP that she is scheduled to see in 08/2023 will probably be upset with her or reprimand her if she doesn't stay UTD on her DEXA screenings.   Pt also advised that provider will more than likely recommend that she been seen for B&P since last was in 02/2022, pt agreeable to schedule. Will send msg to appt desk to contact pt.   Please advise on recommendation as far as continuing DEXAs with same location or try for alternate location for a sooner date? TIA

## 2023-06-03 NOTE — Telephone Encounter (Signed)
FYIArlee Schwartz is scheduled for 06/17/2023. Order placed for DEXA. Will notify pt of Dr. Rica Records recommendations and let her know that [facility] doesn't have a cancellation list but encourage pt's to call as often as they would like (daily) to check for cancellations for a sooner appt.   Pt notified and voiced understanding. Will leave in my inbasket to f/u on when pt gets her DEXA.

## 2023-06-03 NOTE — Telephone Encounter (Signed)
Thank you for the update!

## 2023-06-10 ENCOUNTER — Ambulatory Visit: Payer: Medicare HMO | Admitting: Obstetrics and Gynecology

## 2023-06-17 DIAGNOSIS — Z1231 Encounter for screening mammogram for malignant neoplasm of breast: Secondary | ICD-10-CM

## 2023-08-05 ENCOUNTER — Ambulatory Visit
Admission: RE | Admit: 2023-08-05 | Discharge: 2023-08-05 | Disposition: A | Discharge status: Home / Self Care | Payer: Medicare HMO | Source: Ambulatory Visit | Attending: Internal Medicine | Admitting: Internal Medicine

## 2023-08-05 DIAGNOSIS — Z1231 Encounter for screening mammogram for malignant neoplasm of breast: Secondary | ICD-10-CM | POA: Diagnosis not present

## 2023-08-26 DIAGNOSIS — Z0001 Encounter for general adult medical examination with abnormal findings: Secondary | ICD-10-CM | POA: Diagnosis not present

## 2023-08-26 DIAGNOSIS — Z1389 Encounter for screening for other disorder: Secondary | ICD-10-CM | POA: Diagnosis not present

## 2023-08-26 DIAGNOSIS — M810 Age-related osteoporosis without current pathological fracture: Secondary | ICD-10-CM | POA: Diagnosis not present

## 2023-08-26 DIAGNOSIS — E785 Hyperlipidemia, unspecified: Secondary | ICD-10-CM | POA: Diagnosis not present

## 2023-08-26 DIAGNOSIS — D7589 Other specified diseases of blood and blood-forming organs: Secondary | ICD-10-CM | POA: Diagnosis not present

## 2023-09-02 ENCOUNTER — Other Ambulatory Visit: Payer: Self-pay | Admitting: Internal Medicine

## 2023-09-02 DIAGNOSIS — E669 Obesity, unspecified: Secondary | ICD-10-CM | POA: Diagnosis not present

## 2023-09-02 DIAGNOSIS — F419 Anxiety disorder, unspecified: Secondary | ICD-10-CM | POA: Diagnosis not present

## 2023-09-02 DIAGNOSIS — Z23 Encounter for immunization: Secondary | ICD-10-CM | POA: Diagnosis not present

## 2023-09-02 DIAGNOSIS — Z0000 Encounter for general adult medical examination without abnormal findings: Secondary | ICD-10-CM | POA: Diagnosis not present

## 2023-09-02 DIAGNOSIS — Z720 Tobacco use: Secondary | ICD-10-CM | POA: Diagnosis not present

## 2023-09-02 DIAGNOSIS — Z6832 Body mass index (BMI) 32.0-32.9, adult: Secondary | ICD-10-CM | POA: Diagnosis not present

## 2023-09-02 DIAGNOSIS — M810 Age-related osteoporosis without current pathological fracture: Secondary | ICD-10-CM | POA: Diagnosis not present

## 2023-09-02 DIAGNOSIS — Z1339 Encounter for screening examination for other mental health and behavioral disorders: Secondary | ICD-10-CM | POA: Diagnosis not present

## 2023-09-02 DIAGNOSIS — Z1331 Encounter for screening for depression: Secondary | ICD-10-CM | POA: Diagnosis not present

## 2023-09-02 DIAGNOSIS — E785 Hyperlipidemia, unspecified: Secondary | ICD-10-CM

## 2023-09-02 DIAGNOSIS — R7301 Impaired fasting glucose: Secondary | ICD-10-CM | POA: Diagnosis not present

## 2023-09-02 DIAGNOSIS — R490 Dysphonia: Secondary | ICD-10-CM | POA: Diagnosis not present

## 2023-09-16 ENCOUNTER — Other Ambulatory Visit: Payer: Medicare HMO

## 2023-09-16 NOTE — Telephone Encounter (Signed)
Pt remains scheduled for DEXA at [facility] on 12/11/2023.   Ok to close for now or anything additional?

## 2023-09-16 NOTE — Telephone Encounter (Signed)
Good to have [facility] in February.   I would schedule an office visit for her breast and pelvic exam for the end of February or beginning of March, 2025.

## 2023-09-17 NOTE — Telephone Encounter (Signed)
Msg sent to the appt desk.

## 2023-09-17 NOTE — Telephone Encounter (Signed)
Pt scheduled for 01/12/2024.  Encounter closed.

## 2023-09-24 ENCOUNTER — Other Ambulatory Visit: Payer: Medicare HMO

## 2023-12-11 ENCOUNTER — Ambulatory Visit
Admission: RE | Admit: 2023-12-11 | Discharge: 2023-12-11 | Disposition: A | Discharge status: Home / Self Care | Payer: Medicare Other | Source: Ambulatory Visit | Attending: Obstetrics and Gynecology | Admitting: Obstetrics and Gynecology

## 2023-12-11 DIAGNOSIS — M810 Age-related osteoporosis without current pathological fracture: Secondary | ICD-10-CM

## 2023-12-13 ENCOUNTER — Encounter: Payer: Self-pay | Admitting: Obstetrics and Gynecology

## 2023-12-29 NOTE — Progress Notes (Deleted)
 68 y.o. G0P0000 Single Caucasian female here for a breast and pelvic exam.    The patient is also followed for ***.  PCP: Melida Quitter, MD   No LMP recorded. Patient is postmenopausal.           Sexually active: No.  The current method of family planning is post menopausal status.    Menopausal hormone therapy:  n/a Exercising: {yes no:314532}  {types:19826} Smoker:  yes  OB History     Gravida  0   Para  0   Term  0   Preterm  0   AB  0   Living  0      SAB  0   IAB  0   Ectopic  0   Multiple  0   Live Births  0           HEALTH MAINTENANCE: Last 2 paps: 02/27/22 neg, 09/13/20 neg: HR HPV neg History of abnormal Pap or positive HPV:  no Mammogram:  08/05/23 Breast Density Cat B, BI-RADS CAT 1 neg Colonoscopy:   Bone Density:  12/11/23  Result  osteopenia   Immunization History  Administered Date(s) Administered   Moderna Sars-Covid-2 Vaccination 01/22/2020, 02/19/2020      reports that she has been smoking cigarettes. She has never used smokeless tobacco. She reports that she does not use drugs.  Past Medical History:  Diagnosis Date   Neuropathy    Osteoarthritis    Panic disorder     Past Surgical History:  Procedure Laterality Date   BASAL CELL CARCINOMA EXCISION     TOTAL HIP ARTHROPLASTY Right 05/2020   WISDOM TOOTH EXTRACTION      Current Outpatient Medications  Medication Sig Dispense Refill   acetaminophen (TYLENOL) 325 MG tablet Take 650 mg by mouth every 6 (six) hours as needed.     amoxicillin (AMOXIL) 500 MG tablet SMARTSIG:4 Tablet(s) By Mouth     diclofenac (VOLTAREN) 75 MG EC tablet Take 1 tablet by mouth 2 (two) times daily as needed.     Ibuprofen 200 MG CAPS Advil     loratadine-pseudoephedrine (ALLERGY RELIEF D-12) 5-120 MG tablet Take 1 tablet by mouth 2 (two) times daily as needed for allergies.     Multiple Vitamin (MULTIVITAMIN) tablet Take 1 tablet by mouth as needed.      No current facility-administered  medications for this visit.    ALLERGIES: Naproxen  Family History  Problem Relation Age of Onset   Cancer Mother 12   Osteoarthritis Mother    Uterine cancer Mother    Gallstones Mother    Alzheimer's disease Father    Hypertension Father    Neuropathy Father    Hypertension Brother    Hypertension Brother    Hypertension Brother    Cancer Brother    Throat cancer Brother    Diabetes Paternal Grandmother     Review of Systems  PHYSICAL EXAM:  There were no vitals taken for this visit.    General appearance: alert, cooperative and appears stated age Head: normocephalic, without obvious abnormality, atraumatic Neck: no adenopathy, supple, symmetrical, trachea midline and thyroid normal to inspection and palpation Lungs: clear to auscultation bilaterally Breasts: normal appearance, no masses or tenderness, No nipple retraction or dimpling, No nipple discharge or bleeding, No axillary adenopathy Heart: regular rate and rhythm Abdomen: soft, non-tender; no masses, no organomegaly Extremities: extremities normal, atraumatic, no cyanosis or edema Skin: skin color, texture, turgor normal. No rashes or lesions Lymph  nodes: cervical, supraclavicular, and axillary nodes normal. Neurologic: grossly normal  Pelvic: External genitalia:  no lesions              No abnormal inguinal nodes palpated.              Urethra:  normal appearing urethra with no masses, tenderness or lesions              Bartholins and Skenes: normal                 Vagina: normal appearing vagina with normal color and discharge, no lesions              Cervix: no lesions              Pap taken: {yes no:314532} Bimanual Exam:  Uterus:  normal size, contour, position, consistency, mobility, non-tender              Adnexa: no mass, fullness, tenderness              Rectal exam: {yes no:314532}.  Confirms.              Anus:  normal sphincter tone, no lesions  Chaperone was present for exam:   {BSCHAPERONE:31226::"Chayah Mckee F, CMA"}  ASSESSMENT: Encounter for breast and pelvic exam.   ***  PLAN: Mammogram screening discussed. Self breast awareness reviewed. Pap and HRV collected:  {yes no:314532} Guidelines for Calcium, Vitamin D, regular exercise program including cardiovascular and weight bearing exercise. Medication refills:  *** {LABS (Optional):23779} Follow up:  ***    Additional counseling given.  {yes [id number]. ***  total time was spent for this patient encounter, including preparation, face-to-face counseling with the patient, coordination of care, and documentation of the encounter in addition to doing the breast and pelvic exam.

## 2024-01-12 ENCOUNTER — Encounter: Payer: Medicare HMO | Admitting: Obstetrics and Gynecology

## 2024-08-04 ENCOUNTER — Other Ambulatory Visit: Payer: Self-pay | Admitting: Internal Medicine

## 2024-08-04 DIAGNOSIS — Z1231 Encounter for screening mammogram for malignant neoplasm of breast: Secondary | ICD-10-CM

## 2024-08-16 ENCOUNTER — Ambulatory Visit
Admission: RE | Admit: 2024-08-16 | Discharge: 2024-08-16 | Disposition: A | Source: Ambulatory Visit | Attending: Internal Medicine

## 2024-08-16 DIAGNOSIS — Z1231 Encounter for screening mammogram for malignant neoplasm of breast: Secondary | ICD-10-CM
